# Patient Record
Sex: Female | Born: 1980
Health system: Southern US, Community
[De-identification: ages and names within clinical notes are randomized; demographics above are authoritative.]

## PROBLEM LIST (undated history)

## (undated) DIAGNOSIS — C4491 Basal cell carcinoma of skin, unspecified: Secondary | ICD-10-CM

## (undated) DIAGNOSIS — B029 Zoster without complications: Secondary | ICD-10-CM

## (undated) DIAGNOSIS — G43909 Migraine, unspecified, not intractable, without status migrainosus: Secondary | ICD-10-CM

## (undated) DIAGNOSIS — T8859XA Other complications of anesthesia, initial encounter: Secondary | ICD-10-CM

## (undated) DIAGNOSIS — Z1211 Encounter for screening for malignant neoplasm of colon: Secondary | ICD-10-CM

## (undated) DIAGNOSIS — R040 Epistaxis: Secondary | ICD-10-CM

## (undated) DIAGNOSIS — T4145XA Adverse effect of unspecified anesthetic, initial encounter: Secondary | ICD-10-CM

## (undated) DIAGNOSIS — H3320 Serous retinal detachment, unspecified eye: Secondary | ICD-10-CM

## (undated) DIAGNOSIS — T7840XA Allergy, unspecified, initial encounter: Secondary | ICD-10-CM

## (undated) DIAGNOSIS — Z800 Family history of malignant neoplasm of digestive organs: Secondary | ICD-10-CM

## (undated) DIAGNOSIS — B019 Varicella without complication: Secondary | ICD-10-CM

## (undated) DIAGNOSIS — O039 Complete or unspecified spontaneous abortion without complication: Secondary | ICD-10-CM

## (undated) HISTORY — DX: Serous retinal detachment, unspecified eye: H33.20

## (undated) HISTORY — DX: Complete or unspecified spontaneous abortion without complication: O03.9

## (undated) HISTORY — DX: Varicella without complication: B01.9

## (undated) HISTORY — PX: EYE SURGERY: SHX253

## (undated) HISTORY — PX: RETINAL DETACHMENT SURGERY: SHX105

## (undated) HISTORY — DX: Basal cell carcinoma of skin, unspecified: C44.91

## (undated) HISTORY — DX: Epistaxis: R04.0

## (undated) HISTORY — DX: Zoster without complications: B02.9

## (undated) HISTORY — DX: Encounter for screening for malignant neoplasm of colon: Z12.11

## (undated) HISTORY — PX: COSMETIC SURGERY: SHX468

## (undated) HISTORY — DX: Migraine, unspecified, not intractable, without status migrainosus: G43.909

## (undated) HISTORY — PX: AUGMENTATION MAMMAPLASTY: SUR837

## (undated) HISTORY — DX: Allergy, unspecified, initial encounter: T78.40XA

## (undated) HISTORY — DX: Family history of malignant neoplasm of digestive organs: Z80.0

---
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
## 2003-05-26 ENCOUNTER — Other Ambulatory Visit: Admission: RE | Admit: 2003-05-26 | Discharge: 2003-05-26 | Payer: Self-pay | Admitting: Obstetrics & Gynecology

## 2006-11-05 ENCOUNTER — Other Ambulatory Visit: Admission: RE | Admit: 2006-11-05 | Discharge: 2006-11-05 | Payer: Self-pay | Admitting: Obstetrics & Gynecology

## 2007-10-14 LAB — CONVERTED CEMR LAB: Pap Smear: NORMAL

## 2007-11-11 ENCOUNTER — Other Ambulatory Visit: Admission: RE | Admit: 2007-11-11 | Discharge: 2007-11-11 | Payer: Self-pay | Admitting: Obstetrics & Gynecology

## 2008-09-21 ENCOUNTER — Ambulatory Visit: Payer: Self-pay | Admitting: Family Medicine

## 2008-11-24 ENCOUNTER — Other Ambulatory Visit: Admission: RE | Admit: 2008-11-24 | Discharge: 2008-11-24 | Payer: Self-pay | Admitting: Obstetrics & Gynecology

## 2009-07-13 DIAGNOSIS — O039 Complete or unspecified spontaneous abortion without complication: Secondary | ICD-10-CM

## 2009-07-13 HISTORY — DX: Complete or unspecified spontaneous abortion without complication: O03.9

## 2010-04-24 ENCOUNTER — Observation Stay: Payer: Self-pay

## 2010-04-25 ENCOUNTER — Encounter: Payer: Self-pay | Admitting: Obstetrics and Gynecology

## 2010-05-30 ENCOUNTER — Encounter: Payer: Self-pay | Admitting: Obstetrics and Gynecology

## 2010-06-27 ENCOUNTER — Encounter: Payer: Self-pay | Admitting: Obstetrics and Gynecology

## 2010-07-19 ENCOUNTER — Inpatient Hospital Stay: Payer: Self-pay

## 2011-04-23 IMAGING — US US OB FOLLOW-UP - NRPT [facility]
1 series · 14 of 28 positions shown · non-contrast
Comparison: none

[Series 1: us ob follow-up - nrpt [facility] · 14 of 52 slices shown]
[im 2/52]
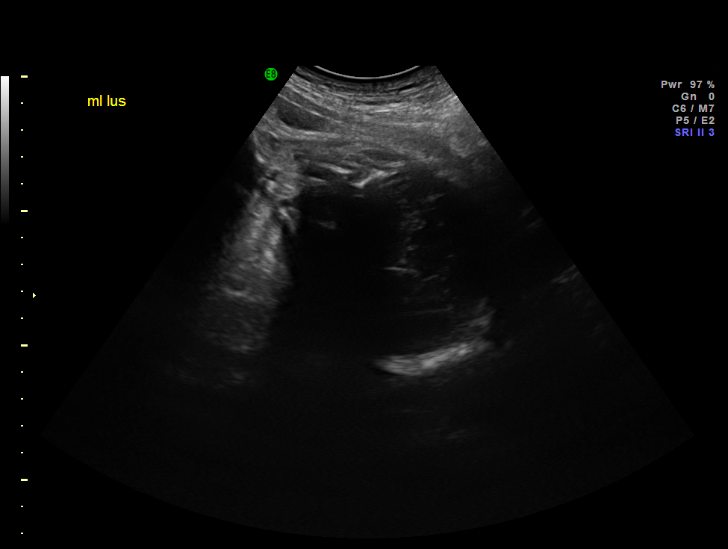
[im 6/52]
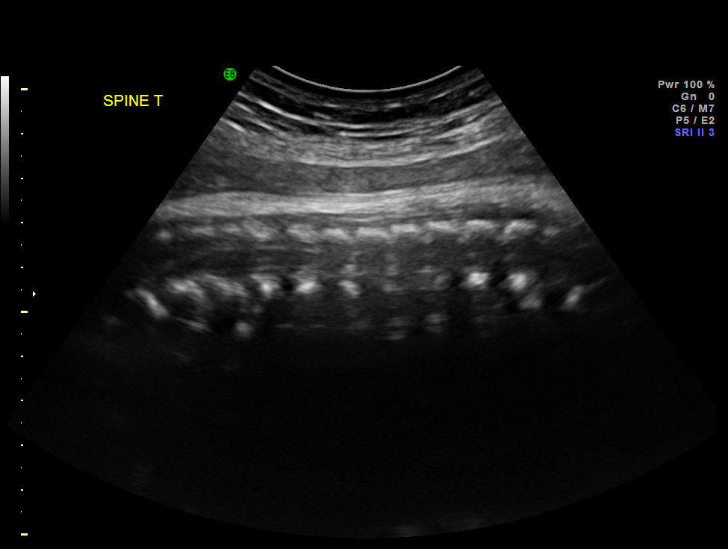
[im 10/52]
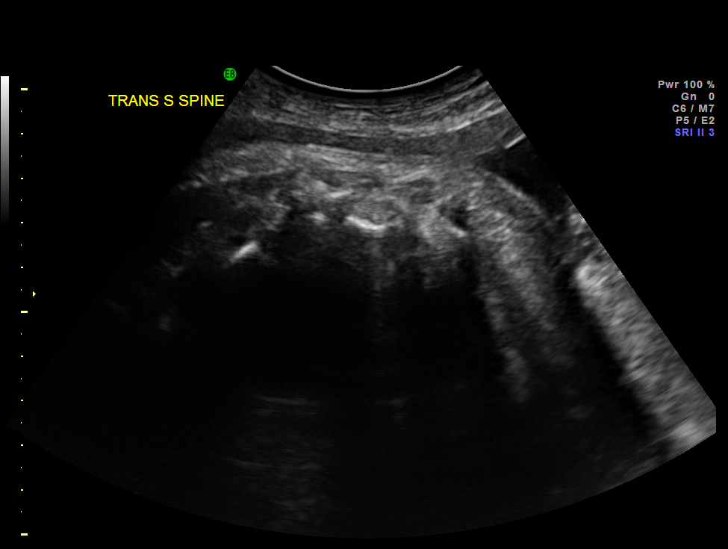
[im 14/52]
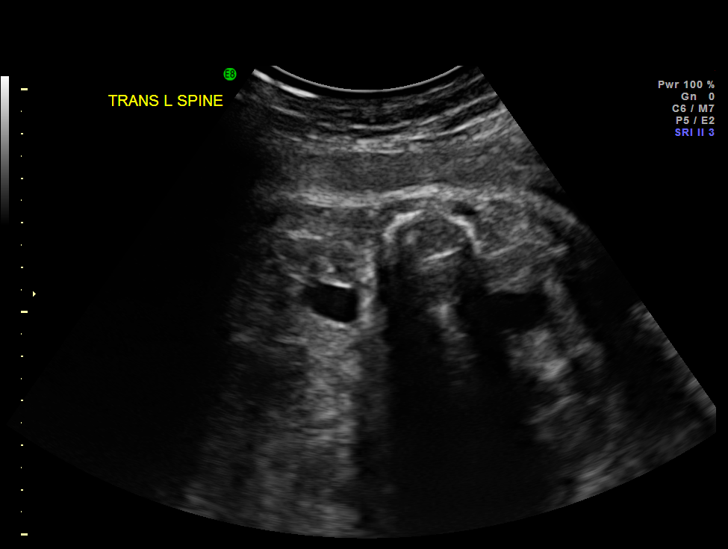
[im 18/52]
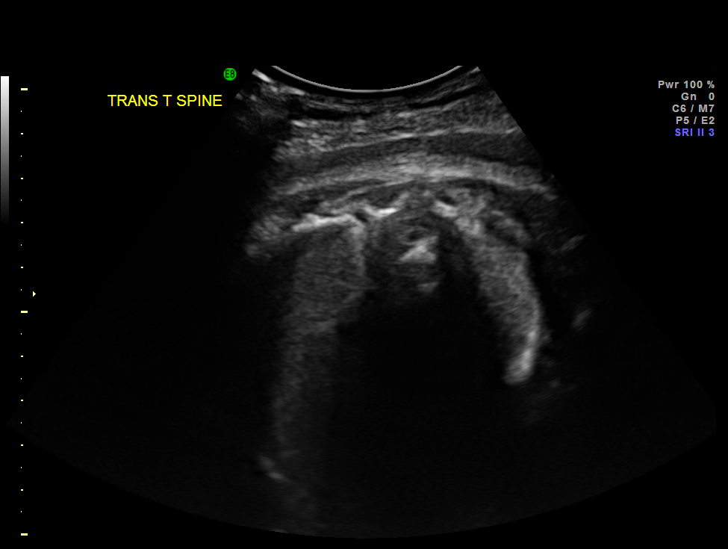
[im 21/52]
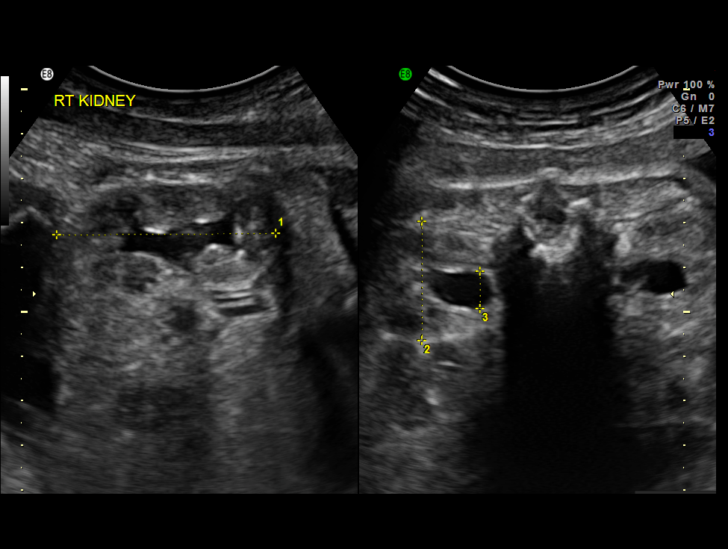
[im 25/52]
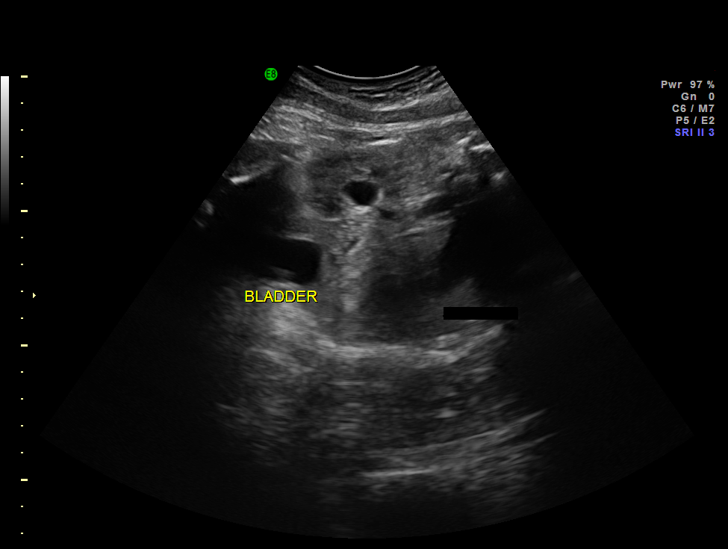
[im 29/52]
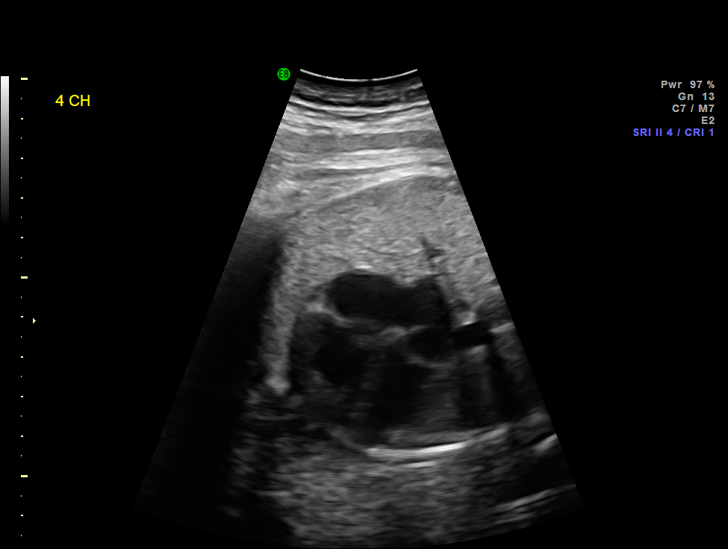
[im 33/52]
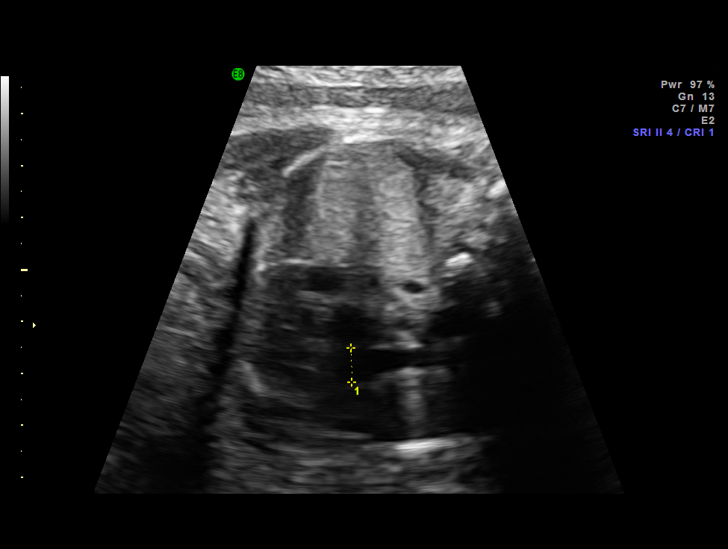
[im 36/52]
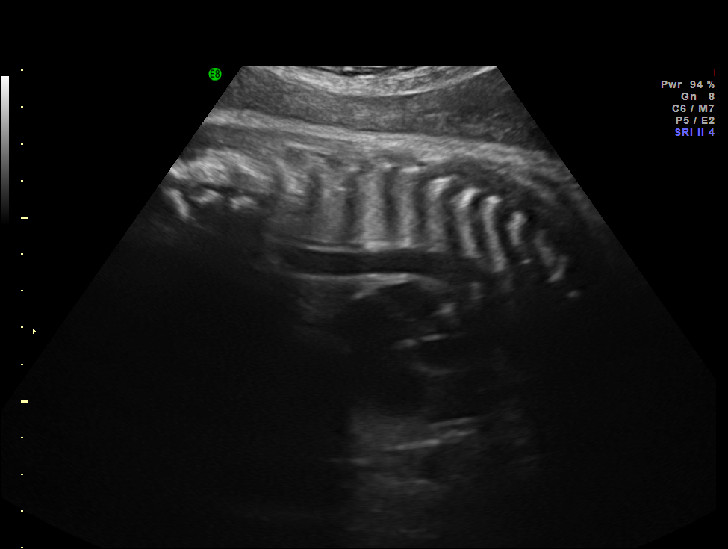
[im 40/52]
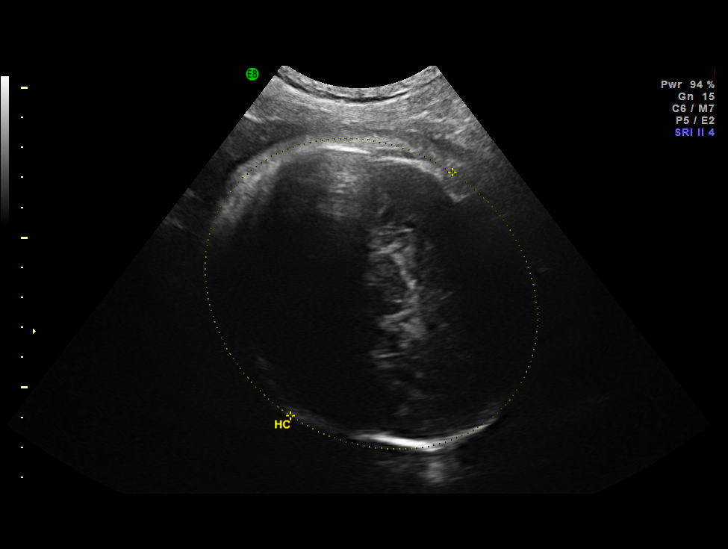
[im 44/52]
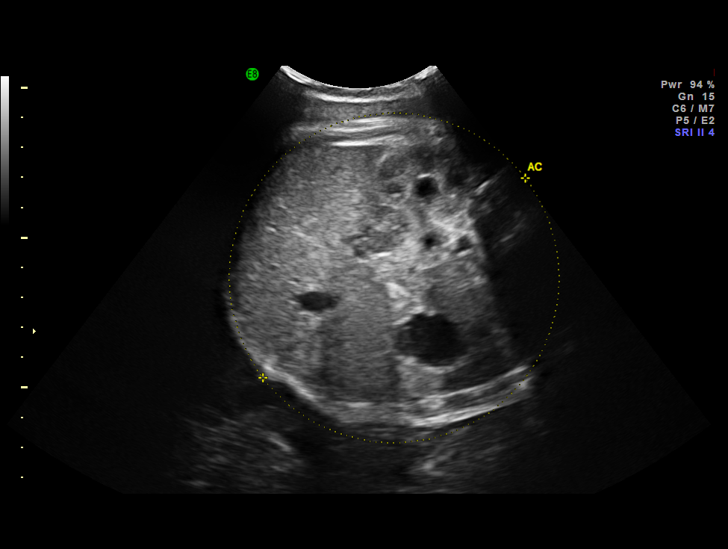
[im 48/52]
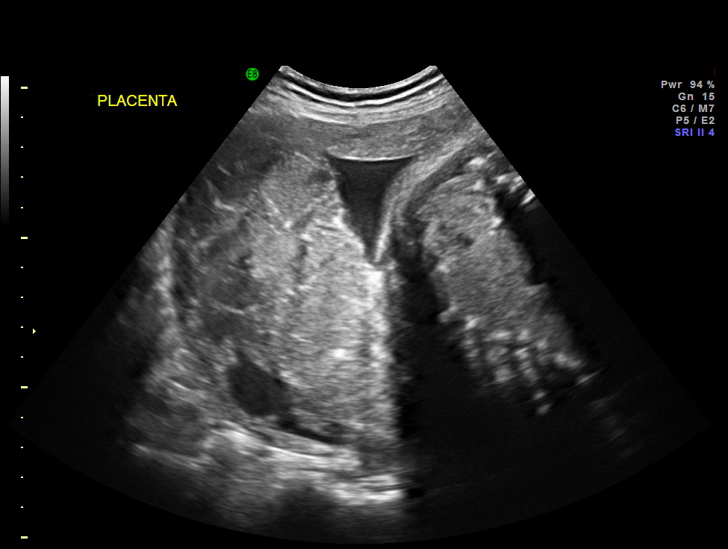
[im 52/52]
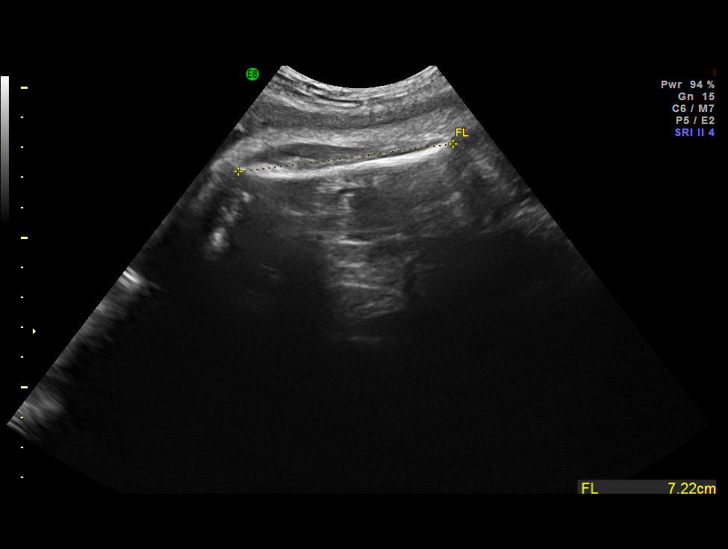

[14 of 28 positions shown; findings below may reference images not displayed]

IMAGES IMPORTED FROM THE SYNGO WORKFLOW SYSTEM
NO DICTATION FOR STUDY

## 2012-11-02 ENCOUNTER — Inpatient Hospital Stay: Payer: Self-pay

## 2012-11-02 LAB — CBC WITH DIFFERENTIAL/PLATELET
Basophil %: 0.4 %
Eosinophil %: 0.9 %
Lymphocyte #: 1.2 10*3/uL (ref 1.0–3.6)
Lymphocyte %: 12 %
MCHC: 34.2 g/dL (ref 32.0–36.0)
Monocyte #: 1 x10 3/mm — ABNORMAL HIGH (ref 0.2–0.9)
Monocyte %: 10 %
Neutrophil #: 7.5 10*3/uL — ABNORMAL HIGH (ref 1.4–6.5)
Neutrophil %: 76.7 %
RBC: 4.49 10*6/uL (ref 3.80–5.20)
RDW: 14.2 % (ref 11.5–14.5)

## 2012-11-04 LAB — HEMATOCRIT: HCT: 37 % (ref 35.0–47.0)

## 2014-07-13 HISTORY — PX: BREAST ENHANCEMENT SURGERY: SHX7

## 2015-02-20 NOTE — H&P (Signed)
L&D Evaluation:  History:   HPI 31o G3P1011 at [redacted]w[redacted]d presented to L&D last night in early labor, overnight contractions spaced out and she was able to sleep.  This morning pt is interested in augementation as her induction is scheduled in 2 days.  PNC at [facility], uncomplicated.  PNL - O+, RI, VI, GBS+    Presents with contractions    Patient's Medical History No Chronic Illness    Patient's Surgical History none    Medications Pre Natal Vitamins    Allergies NKDA    Social History none    Family History Non-Contributory   ROS:   ROS All systems were reviewed.  HEENT, CNS, GI, GU, Respiratory, CV, Renal and Musculoskeletal systems were found to be normal.   Exam:   Vital Signs stable    General no apparent distress    Mental Status clear    Chest normal effort    Abdomen gravid, non-tender    Estimated Fetal Weight Average for gestational age    Pelvic no external lesions, 4/ 80/ -1    Mebranes Intact    FHT normal rate with no decels, rective NST    Ucx irregular    Skin dry   Impression:   Impression G3P1011 at [redacted]w[redacted]d in latent labor   Plan:   Plan EFM/NST, monitor contractions and for cervical change, antibiotics for GBBS prophylaxis    Comments - discussed options including risks/benefits of continued expectant management vs. augmentation with artificial rupture of membranes and/or pitocin.  Pt is interested in artificial rupture of membranes then will monitor for ~1hour and start pitocin if contractions don't pick up.  (She has already received 2 doses of antibiotics)   Electronic Signatures: Malachi Paradise (MD)  (Signed 22-Jan-14 07:15)  Authored: L&D Evaluation   Last Updated: 22-Jan-14 07:15 by Thurmond Butts, Elinor Parkinson (MD)

## 2016-05-13 DIAGNOSIS — H3320 Serous retinal detachment, unspecified eye: Secondary | ICD-10-CM

## 2016-05-13 HISTORY — DX: Serous retinal detachment, unspecified eye: H33.20

## 2016-05-21 ENCOUNTER — Ambulatory Visit (INDEPENDENT_AMBULATORY_CARE_PROVIDER_SITE_OTHER): Payer: BLUE CROSS/BLUE SHIELD | Admitting: Family Medicine

## 2016-05-21 ENCOUNTER — Encounter: Payer: Self-pay | Admitting: Family Medicine

## 2016-05-21 ENCOUNTER — Ambulatory Visit (INDEPENDENT_AMBULATORY_CARE_PROVIDER_SITE_OTHER): Payer: BLUE CROSS/BLUE SHIELD

## 2016-05-21 VITALS — BP 117/81 | HR 81 | Temp 98.2°F | Ht 68.5 in | Wt 135.2 lb

## 2016-05-21 DIAGNOSIS — Z130 Encounter for screening for diseases of the blood and blood-forming organs and certain disorders involving the immune mechanism: Secondary | ICD-10-CM

## 2016-05-21 DIAGNOSIS — M25551 Pain in right hip: Secondary | ICD-10-CM

## 2016-05-21 DIAGNOSIS — H539 Unspecified visual disturbance: Secondary | ICD-10-CM | POA: Diagnosis not present

## 2016-05-21 DIAGNOSIS — Z13220 Encounter for screening for lipoid disorders: Secondary | ICD-10-CM

## 2016-05-21 DIAGNOSIS — Z0000 Encounter for general adult medical examination without abnormal findings: Secondary | ICD-10-CM

## 2016-05-21 DIAGNOSIS — Z719 Counseling, unspecified: Secondary | ICD-10-CM | POA: Insufficient documentation

## 2016-05-21 LAB — COMPREHENSIVE METABOLIC PANEL
ALBUMIN: 4.6 g/dL (ref 3.5–5.2)
ALK PHOS: 68 U/L (ref 39–117)
ALT: 18 U/L (ref 0–35)
AST: 17 U/L (ref 0–37)
BILIRUBIN TOTAL: 0.5 mg/dL (ref 0.2–1.2)
BUN: 10 mg/dL (ref 6–23)
CO2: 33 meq/L — AB (ref 19–32)
Calcium: 9.4 mg/dL (ref 8.4–10.5)
Chloride: 103 mEq/L (ref 96–112)
Creatinine, Ser: 0.78 mg/dL (ref 0.40–1.20)
GFR: 90.12 mL/min (ref >60.00)
GLUCOSE: 73 mg/dL (ref 70–99)
Potassium: 4.1 mEq/L (ref 3.5–5.1)
Sodium: 140 mEq/L (ref 135–145)
Total Protein: 7.2 g/dL (ref 6.0–8.3)

## 2016-05-21 LAB — CBC
HCT: 43.2 % (ref 36.0–46.0)
HEMOGLOBIN: 14.6 g/dL (ref 12.0–15.0)
MCHC: 33.8 g/dL (ref 30.0–36.0)
MCV: 86.5 fl (ref 78.0–100.0)
PLATELETS: 247 10*3/uL (ref 150.0–400.0)
RBC: 4.99 Mil/uL (ref 3.87–5.11)
RDW: 12.7 % (ref 11.5–15.5)
WBC: 5.8 10*3/uL (ref 4.0–10.5)

## 2016-05-21 LAB — LIPID PANEL
CHOL/HDL RATIO: 2
Cholesterol: 133 mg/dL (ref 0–200)
HDL: 60.5 mg/dL (ref >39.00)
LDL Cholesterol: 63 mg/dL (ref 0–99)
NONHDL: 72.96
Triglycerides: 48 mg/dL (ref 0.0–149.0)
VLDL: 9.6 mg/dL (ref 0.0–40.0)

## 2016-05-21 NOTE — Patient Instructions (Signed)
Get your eyes checked.  We will call with your xray results as well as with your lab results.  Follow up annually.  Take care  Dr. Lacinda Axon

## 2016-05-21 NOTE — Assessment & Plan Note (Signed)
Screening labs today

## 2016-05-21 NOTE — Assessment & Plan Note (Signed)
New acute problem. Patient had groin pain with ROM and especially with FADIR. Obtaining xray for further evaluation.

## 2016-05-21 NOTE — Progress Notes (Signed)
Subjective:  Patient ID: Ruth Williams, female    DOB: 1980-12-08  Age: 35 y.o. MRN: [id number]  CC: Establish care, Hip pain, Vision change  HPI Ruth Williams is a 35 y.o. female presents to the clinic today with the above complaints.  Right hip pain  Patient reports she's had right hip pain for months.  Pain is located laterally around the greater trochanter.  Worse at night with lying on her right side.  She does not recall any fall, trauma, injury.  Has been quite persistent and severe. However, has resolved over the past few days.  She is concerned about her recent hip pain and would like to discuss this further today.  Vision change  Patient states that for the past several weeks she's noticed a dark spot in her vision (right eye).  Patient states that it's intermittent.  Located in the right upper visual field.  She previously thought that she was having a migrainous she's had a history of visual disturbance of migraine in the past. However, she's had no headache, nausea, vomiting that is typical of her migraines.  Her last visual exam was a year ago and was normal.  No reports of flashing lights or floaters.  Left eye not affected.  No other social symptoms.  No other complaints this time.  PMH, Surgical Hx, Family Hx, Social History reviewed and updated as below.  Past Medical History:  Diagnosis Date  . Chicken pox   . Migraines    Past Surgical History:  Procedure Laterality Date  . BREAST ENHANCEMENT SURGERY  07/13/2014   Family History  Problem Relation Age of Onset  . Mental illness Mother   . Hypertension Mother   . Colon cancer Father   . Bladder Cancer Father    Social History  Substance Use Topics  . Smoking status: Never Smoker  . Smokeless tobacco: Never Used  . Alcohol use 3.0 oz/week    5 Glasses of wine per week    Review of Systems  Eyes: Positive for visual disturbance.  Musculoskeletal:       Right hip  pain.  Psychiatric/Behavioral:       Sadness.  All other systems reviewed and are negative.   Objective:   Today's Vitals: BP 117/81 (BP Location: Right Arm, Patient Position: Sitting, Cuff Size: Normal)   Pulse 81   Temp 98.2 F (36.8 C) (Oral)   Ht 5' 8.5" (1.74 m)   Wt 135 lb 4 oz (61.3 kg)   SpO2 100%   BMI 20.27 kg/m   Physical Exam  Constitutional: She is oriented to person, place, and time. She appears well-developed. No distress.  HENT:  Head: Normocephalic and atraumatic.  Mouth/Throat: Oropharynx is clear and moist.  Eyes: Conjunctivae are normal. No scleral icterus.  Neck: Neck supple.  Cardiovascular: Normal rate and regular rhythm.   Soft systolic murmur.  Pulmonary/Chest: Effort normal. She has no wheezes. She has no rales.  Abdominal: Soft. She exhibits no distension. There is no tenderness.  Musculoskeletal:  Hip: Right Full ROM. Pelvic alignment unremarkable to inspection and palpation. Greater trochanter without tenderness to palpation. Pain with FADIR.  Neurological: She is alert and oriented to person, place, and time.  Skin: Skin is warm and dry. No rash noted.  Psychiatric:  Flat affect.  Vitals reviewed.  Assessment & Plan:   Problem List Items Addressed This Visit    Health care maintenance    Screening labs today.  Right hip pain - Primary    New acute problem. Patient had groin pain with ROM and especially with FADIR. Obtaining xray for further evaluation.      Relevant Orders   Comp Met (CMET)   DG HIP UNILAT WITH PELVIS 1V RIGHT   Visual disturbance of one eye    New problem. Unclear prognosis/etiology at this time. Exam unremarkable. Advised formal assessment by Ophthalmology.  Patient in agreement.        Other Visit Diagnoses    Screening for deficiency anemia       Relevant Orders   CBC   Screening, lipid       Relevant Orders   Lipid Profile      No outpatient encounter prescriptions on file as of  05/21/2016.   No facility-administered encounter medications on file as of 05/21/2016.     Follow-up: Annually/sooner if needed.  [place]

## 2016-05-21 NOTE — Progress Notes (Signed)
Pre visit review using our clinic review tool, if applicable. No additional management support is needed unless otherwise documented below in the visit note. 

## 2016-05-21 NOTE — Assessment & Plan Note (Addendum)
New problem. Unclear prognosis/etiology at this time. Exam unremarkable. Advised formal assessment by Ophthalmology.  Patient in agreement.

## 2016-05-22 ENCOUNTER — Telehealth: Payer: Self-pay | Admitting: Family Medicine

## 2016-05-22 NOTE — Telephone Encounter (Signed)
Patient was given results 

## 2016-05-22 NOTE — Telephone Encounter (Signed)
Pt called back returning your call.   Call pt @ [phone]. Thank you!

## 2016-06-09 ENCOUNTER — Encounter (HOSPITAL_COMMUNITY): Payer: Self-pay | Admitting: *Deleted

## 2016-06-09 ENCOUNTER — Encounter (INDEPENDENT_AMBULATORY_CARE_PROVIDER_SITE_OTHER): Payer: BLUE CROSS/BLUE SHIELD | Admitting: Ophthalmology

## 2016-06-09 DIAGNOSIS — H43813 Vitreous degeneration, bilateral: Secondary | ICD-10-CM | POA: Diagnosis not present

## 2016-06-09 DIAGNOSIS — H338 Other retinal detachments: Secondary | ICD-10-CM

## 2016-06-09 DIAGNOSIS — H33302 Unspecified retinal break, left eye: Secondary | ICD-10-CM

## 2016-06-09 NOTE — H&P (Signed)
Ruth Williams is an 35 y.o. female.   Chief Complaint:Loss of superior visual field right eye HPI: one month progressive loss of superior visual field right eye  Past Medical History:  Diagnosis Date  . Chicken pox   . Migraines     Past Surgical History:  Procedure Laterality Date  . BREAST ENHANCEMENT SURGERY  07/13/2014    Family History  Problem Relation Age of Onset  . Mental illness Mother   . Hypertension Mother   . Colon cancer Father   . Bladder Cancer Father    Social History:  reports that she has never smoked. She has never used smokeless tobacco. She reports that she drinks about 3.0 oz of alcohol per week . She reports that she does not use drugs.  Allergies:  Allergies  Allergen Reactions  . No Known Allergies     No prescriptions prior to admission.    Review of systems otherwise negative  Weight 135 lb 4 oz (61.3 kg).  Physical exam: Mental status: oriented x3. Eyes: See eye exam associated with this date of surgery in media tab.  Scanned in by scanning center Ears, Nose, Throat: within normal limits Neck: Within Normal limits General: within normal limits Chest: Within normal limits Breast: deferred Heart: Within normal limits Abdomen: Within normal limits GU: deferred Extremities: within normal limits Skin: within normal limits  Assessment/Plan Rhegmatogenous retinal detachment right eye.  Retinal break Left eye Plan: To [facility] for Laser for retinal break left eye.  Scleral buckle, laser, gas injection right eye  Hayden Pedro 06/09/2016, 4:23 PM

## 2016-06-10 ENCOUNTER — Encounter (HOSPITAL_COMMUNITY): Payer: Self-pay | Admitting: Surgery

## 2016-06-10 ENCOUNTER — Ambulatory Visit (HOSPITAL_COMMUNITY): Payer: BLUE CROSS/BLUE SHIELD | Admitting: Certified Registered Nurse Anesthetist

## 2016-06-10 ENCOUNTER — Ambulatory Visit (HOSPITAL_COMMUNITY)
Admission: RE | Admit: 2016-06-10 | Discharge: 2016-06-11 | Disposition: A | Discharge status: Home / Self Care | Payer: BLUE CROSS/BLUE SHIELD | Source: Ambulatory Visit | Attending: Ophthalmology | Admitting: Ophthalmology

## 2016-06-10 ENCOUNTER — Encounter (HOSPITAL_COMMUNITY)
Admission: RE | Disposition: A | Discharge status: Home / Self Care | Payer: Self-pay | Source: Ambulatory Visit | Attending: Ophthalmology

## 2016-06-10 DIAGNOSIS — H33002 Unspecified retinal detachment with retinal break, left eye: Secondary | ICD-10-CM | POA: Insufficient documentation

## 2016-06-10 DIAGNOSIS — H33302 Unspecified retinal break, left eye: Secondary | ICD-10-CM | POA: Diagnosis not present

## 2016-06-10 DIAGNOSIS — H33001 Unspecified retinal detachment with retinal break, right eye: Secondary | ICD-10-CM | POA: Diagnosis not present

## 2016-06-10 HISTORY — PX: AIR/FLUID EXCHANGE: SHX6494

## 2016-06-10 HISTORY — DX: Adverse effect of unspecified anesthetic, initial encounter: T41.45XA

## 2016-06-10 HISTORY — DX: Other complications of anesthesia, initial encounter: T88.59XA

## 2016-06-10 HISTORY — PX: PHOTOCOAGULATION WITH LASER: SHX6027

## 2016-06-10 HISTORY — PX: SCLERAL BUCKLE: SHX5340

## 2016-06-10 SURGERY — PHOTOCOAGULATION, EYE, USING LASER
Anesthesia: General | Site: Eye | Laterality: Right

## 2016-06-10 MED ORDER — PHENYLEPHRINE HCL 10 MG/ML IJ SOLN
INTRAVENOUS | Status: DC | PRN
  Administered 2016-06-10: 20 ug/min via INTRAVENOUS

## 2016-06-10 MED ORDER — ATROPINE SULFATE 1 % OP SOLN
OPHTHALMIC | Status: DC | PRN
  Administered 2016-06-10: 1 [drp] via OPHTHALMIC

## 2016-06-10 MED ORDER — DEXAMETHASONE SODIUM PHOSPHATE 10 MG/ML IJ SOLN
INTRAMUSCULAR | Status: DC | PRN
  Administered 2016-06-10: 10 mg

## 2016-06-10 MED ORDER — FENTANYL CITRATE (PF) 100 MCG/2ML IJ SOLN
INTRAMUSCULAR | Status: AC
  Filled 2016-06-10: qty 4

## 2016-06-10 MED ORDER — SUGAMMADEX SODIUM 200 MG/2ML IV SOLN
INTRAVENOUS | Status: AC
  Filled 2016-06-10: qty 4

## 2016-06-10 MED ORDER — MORPHINE SULFATE (PF) 2 MG/ML IV SOLN
1.0000 mg | INTRAVENOUS | Status: DC | PRN
Start: 2016-06-10 — End: 2016-06-11

## 2016-06-10 MED ORDER — PROPOFOL 10 MG/ML IV BOLUS
INTRAVENOUS | Status: AC
  Filled 2016-06-10: qty 20

## 2016-06-10 MED ORDER — ONDANSETRON HCL 4 MG/2ML IJ SOLN: 4.0000 mg | Freq: Four times a day (QID) | INTRAMUSCULAR | Status: DC | PRN

## 2016-06-10 MED ORDER — BACITRACIN-POLYMYXIN B 500-10000 UNIT/GM OP OINT
1.0000 "application " | TOPICAL_OINTMENT | Freq: Three times a day (TID) | OPHTHALMIC | Status: DC
  Filled 2016-06-10: qty 3.5

## 2016-06-10 MED ORDER — GLYCOPYRROLATE 0.2 MG/ML IV SOSY
PREFILLED_SYRINGE | INTRAVENOUS | Status: DC | PRN
  Administered 2016-06-10: .4 mg via INTRAVENOUS

## 2016-06-10 MED ORDER — TETRACAINE HCL 0.5 % OP SOLN
2.0000 [drp] | Freq: Once | OPHTHALMIC | Status: DC
  Filled 2016-06-10: qty 2

## 2016-06-10 MED ORDER — LEVONORGESTREL 20 MCG/24HR IU IUD: 1.0000 | INTRAUTERINE_SYSTEM | Freq: Once | INTRAUTERINE | Status: DC

## 2016-06-10 MED ORDER — ACETAZOLAMIDE SODIUM 500 MG IJ SOLR
INTRAMUSCULAR | Status: AC
  Filled 2016-06-10: qty 500

## 2016-06-10 MED ORDER — DORZOLAMIDE HCL 2 % OP SOLN
1.0000 [drp] | Freq: Three times a day (TID) | OPHTHALMIC | Status: DC
  Filled 2016-06-10 (×2): qty 10

## 2016-06-10 MED ORDER — PHENYLEPHRINE HCL 2.5 % OP SOLN
1.0000 [drp] | OPHTHALMIC | Status: AC
  Administered 2016-06-10 (×3): 1 [drp] via OPHTHALMIC
  Filled 2016-06-10: qty 2

## 2016-06-10 MED ORDER — NEOSTIGMINE METHYLSULFATE 5 MG/5ML IV SOSY
PREFILLED_SYRINGE | INTRAVENOUS | Status: DC | PRN
  Administered 2016-06-10: 3 mg via INTRAVENOUS

## 2016-06-10 MED ORDER — LIDOCAINE 2% (20 MG/ML) 5 ML SYRINGE
INTRAMUSCULAR | Status: DC | PRN
  Administered 2016-06-10: 70 mg via INTRAVENOUS

## 2016-06-10 MED ORDER — BRIMONIDINE TARTRATE 0.2 % OP SOLN
1.0000 [drp] | Freq: Two times a day (BID) | OPHTHALMIC | Status: DC
  Filled 2016-06-10: qty 5

## 2016-06-10 MED ORDER — HYPROMELLOSE (GONIOSCOPIC) 2.5 % OP SOLN
OPHTHALMIC | Status: AC
  Filled 2016-06-10: qty 15

## 2016-06-10 MED ORDER — TEMAZEPAM 15 MG PO CAPS: 15.0000 mg | ORAL_CAPSULE | Freq: Every evening | ORAL | Status: DC | PRN

## 2016-06-10 MED ORDER — ATROPINE SULFATE 1 % OP SOLN
OPHTHALMIC | Status: AC
  Filled 2016-06-10: qty 5

## 2016-06-10 MED ORDER — ERYTHROMYCIN 5 MG/GM OP OINT
TOPICAL_OINTMENT | OPHTHALMIC | Status: DC | PRN
Start: 2016-06-10 — End: 2016-06-10
  Administered 2016-06-10: 1 via OPHTHALMIC

## 2016-06-10 MED ORDER — EPINEPHRINE HCL 1 MG/ML IJ SOLN
INTRAMUSCULAR | Status: AC
  Filled 2016-06-10: qty 1

## 2016-06-10 MED ORDER — GATIFLOXACIN 0.5 % OP SOLN
1.0000 [drp] | Freq: Four times a day (QID) | OPHTHALMIC | Status: DC
  Filled 2016-06-10: qty 2.5

## 2016-06-10 MED ORDER — ALBUTEROL SULFATE HFA 108 (90 BASE) MCG/ACT IN AERS
INHALATION_SPRAY | RESPIRATORY_TRACT | Status: AC
  Filled 2016-06-10: qty 6.7

## 2016-06-10 MED ORDER — DEXAMETHASONE SODIUM PHOSPHATE 10 MG/ML IJ SOLN
INTRAMUSCULAR | Status: DC | PRN
  Administered 2016-06-10: 10 mg via INTRAVENOUS

## 2016-06-10 MED ORDER — BSS IO SOLN
INTRAOCULAR | Status: DC | PRN
  Administered 2016-06-10: 15 mL via INTRAOCULAR

## 2016-06-10 MED ORDER — IBUPROFEN 200 MG PO TABS
200.0000 mg | ORAL_TABLET | Freq: Every day | ORAL | Status: DC | PRN
Start: 2016-06-10 — End: 2016-06-11

## 2016-06-10 MED ORDER — DEXAMETHASONE SODIUM PHOSPHATE 10 MG/ML IJ SOLN
INTRAMUSCULAR | Status: AC
  Filled 2016-06-10: qty 1

## 2016-06-10 MED ORDER — BUPIVACAINE HCL (PF) 0.75 % IJ SOLN
INTRAMUSCULAR | Status: AC
  Filled 2016-06-10: qty 10

## 2016-06-10 MED ORDER — FENTANYL CITRATE (PF) 100 MCG/2ML IJ SOLN
INTRAMUSCULAR | Status: AC
  Filled 2016-06-10: qty 2

## 2016-06-10 MED ORDER — ONDANSETRON HCL 4 MG/2ML IJ SOLN
INTRAMUSCULAR | Status: DC | PRN
  Administered 2016-06-10: 4 mg via INTRAVENOUS

## 2016-06-10 MED ORDER — LATANOPROST 0.005 % OP SOLN
1.0000 [drp] | Freq: Every day | OPHTHALMIC | Status: DC
  Filled 2016-06-10 (×2): qty 2.5

## 2016-06-10 MED ORDER — BUPIVACAINE HCL (PF) 0.75 % IJ SOLN
INTRAMUSCULAR | Status: DC | PRN
  Administered 2016-06-10: 10 mL

## 2016-06-10 MED ORDER — TROPICAMIDE 1 % OP SOLN
1.0000 [drp] | OPHTHALMIC | Status: AC
  Administered 2016-06-10 (×3): 1 [drp] via OPHTHALMIC
  Filled 2016-06-10: qty 3

## 2016-06-10 MED ORDER — POLYMYXIN B SULFATE 500000 UNITS IJ SOLR
INTRAMUSCULAR | Status: AC
  Filled 2016-06-10: qty 1

## 2016-06-10 MED ORDER — PROPOFOL 10 MG/ML IV BOLUS
INTRAVENOUS | Status: DC | PRN
  Administered 2016-06-10: 50 mg via INTRAVENOUS
  Administered 2016-06-10: 20 mg via INTRAVENOUS
  Administered 2016-06-10: 150 mg via INTRAVENOUS

## 2016-06-10 MED ORDER — ROCURONIUM BROMIDE 10 MG/ML (PF) SYRINGE
PREFILLED_SYRINGE | INTRAVENOUS | Status: AC
  Filled 2016-06-10: qty 10

## 2016-06-10 MED ORDER — PREDNISOLONE ACETATE 1 % OP SUSP
1.0000 [drp] | Freq: Four times a day (QID) | OPHTHALMIC | Status: DC
  Filled 2016-06-10: qty 5

## 2016-06-10 MED ORDER — SODIUM CHLORIDE 0.45 % IV SOLN
INTRAVENOUS | Status: DC
  Administered 2016-06-10: 20:00:00 via INTRAVENOUS

## 2016-06-10 MED ORDER — PHENYLEPHRINE 40 MCG/ML (10ML) SYRINGE FOR IV PUSH (FOR BLOOD PRESSURE SUPPORT)
PREFILLED_SYRINGE | INTRAVENOUS | Status: DC | PRN
  Administered 2016-06-10: 40 ug via INTRAVENOUS

## 2016-06-10 MED ORDER — SODIUM HYALURONATE 10 MG/ML IO SOLN
INTRAOCULAR | Status: AC
  Filled 2016-06-10: qty 0.85

## 2016-06-10 MED ORDER — HYDROCODONE-ACETAMINOPHEN 5-325 MG PO TABS: 1.0000 | ORAL_TABLET | ORAL | Status: DC | PRN

## 2016-06-10 MED ORDER — CEFTAZIDIME 1 G IJ SOLR
INTRAMUSCULAR | Status: AC
  Filled 2016-06-10: qty 1

## 2016-06-10 MED ORDER — MAGNESIUM HYDROXIDE 400 MG/5ML PO SUSP: 15.0000 mL | Freq: Four times a day (QID) | ORAL | Status: DC | PRN

## 2016-06-10 MED ORDER — LIDOCAINE HCL 2 % IJ SOLN
INTRAMUSCULAR | Status: AC
  Filled 2016-06-10: qty 20

## 2016-06-10 MED ORDER — BSS PLUS IO SOLN
INTRAOCULAR | Status: AC
  Filled 2016-06-10: qty 500

## 2016-06-10 MED ORDER — SODIUM CHLORIDE 0.9 % IJ SOLN
INTRAMUSCULAR | Status: AC
  Filled 2016-06-10: qty 10

## 2016-06-10 MED ORDER — LIDOCAINE 2% (20 MG/ML) 5 ML SYRINGE
INTRAMUSCULAR | Status: AC
Start: 2016-06-10 — End: 2016-06-10
  Filled 2016-06-10: qty 10

## 2016-06-10 MED ORDER — ROCURONIUM BROMIDE 10 MG/ML (PF) SYRINGE
PREFILLED_SYRINGE | INTRAVENOUS | Status: DC | PRN
  Administered 2016-06-10 (×4): 10 mg via INTRAVENOUS
  Administered 2016-06-10: 30 mg via INTRAVENOUS

## 2016-06-10 MED ORDER — SODIUM CHLORIDE 0.9 % IV SOLN
INTRAVENOUS | Status: DC
  Administered 2016-06-10 (×2): via INTRAVENOUS

## 2016-06-10 MED ORDER — MIDAZOLAM HCL 5 MG/5ML IJ SOLN
INTRAMUSCULAR | Status: DC | PRN
  Administered 2016-06-10: 2 mg via INTRAVENOUS

## 2016-06-10 MED ORDER — GATIFLOXACIN 0.5 % OP SOLN
1.0000 [drp] | OPHTHALMIC | Status: AC
  Administered 2016-06-10 (×3): 1 [drp] via OPHTHALMIC
  Filled 2016-06-10: qty 2.5

## 2016-06-10 MED ORDER — MIDAZOLAM HCL 2 MG/2ML IJ SOLN
INTRAMUSCULAR | Status: AC
  Filled 2016-06-10: qty 2

## 2016-06-10 MED ORDER — STERILE WATER FOR INJECTION IJ SOLN
INTRAMUSCULAR | Status: AC
  Filled 2016-06-10: qty 20

## 2016-06-10 MED ORDER — TRIAMCINOLONE ACETONIDE 40 MG/ML IJ SUSP
INTRAMUSCULAR | Status: AC
  Filled 2016-06-10: qty 5

## 2016-06-10 MED ORDER — EPHEDRINE 5 MG/ML INJ
INTRAVENOUS | Status: AC
  Filled 2016-06-10: qty 10

## 2016-06-10 MED ORDER — ACETAZOLAMIDE SODIUM 500 MG IJ SOLR
500.0000 mg | Freq: Once | INTRAMUSCULAR | Status: AC
  Administered 2016-06-11: 500 mg via INTRAVENOUS
  Filled 2016-06-10: qty 500

## 2016-06-10 MED ORDER — CYCLOPENTOLATE HCL 1 % OP SOLN
1.0000 [drp] | OPHTHALMIC | Status: AC
  Administered 2016-06-10 (×3): 1 [drp] via OPHTHALMIC
  Filled 2016-06-10: qty 2

## 2016-06-10 MED ORDER — BRIMONIDINE TARTRATE 0.15 % OP SOLN
1.0000 [drp] | Freq: Two times a day (BID) | OPHTHALMIC | Status: DC
  Filled 2016-06-10: qty 5

## 2016-06-10 MED ORDER — ACETAMINOPHEN 325 MG PO TABS: 325.0000 mg | ORAL_TABLET | ORAL | Status: DC | PRN

## 2016-06-10 MED ORDER — CEFAZOLIN SODIUM-DEXTROSE 2-4 GM/100ML-% IV SOLN
2.0000 g | INTRAVENOUS | Status: AC
  Administered 2016-06-10: 2 g via INTRAVENOUS
  Filled 2016-06-10: qty 100

## 2016-06-10 MED ORDER — FENTANYL CITRATE (PF) 100 MCG/2ML IJ SOLN
INTRAMUSCULAR | Status: DC | PRN
  Administered 2016-06-10: 50 ug via INTRAVENOUS
  Administered 2016-06-10: 25 ug via INTRAVENOUS
  Administered 2016-06-10: 100 ug via INTRAVENOUS
  Administered 2016-06-10 (×3): 25 ug via INTRAVENOUS
  Administered 2016-06-10: 50 ug via INTRAVENOUS

## 2016-06-10 MED ORDER — BSS IO SOLN
INTRAOCULAR | Status: AC
  Filled 2016-06-10: qty 15

## 2016-06-10 MED ORDER — STERILE WATER FOR INJECTION IJ SOLN
INTRAMUSCULAR | Status: DC | PRN
  Administered 2016-06-10: 20 mL

## 2016-06-10 SURGICAL SUPPLY — 71 items
APPLICATOR DR MATTHEWS STRL (MISCELLANEOUS) ×28 IMPLANT
BLADE EYE CATARACT 19 1.4 BEAV (BLADE) ×8 IMPLANT
BLADE MVR KNIFE 19G (BLADE) IMPLANT
CANNULA ANT CHAM MAIN (OPHTHALMIC RELATED) IMPLANT
CANNULA DUAL BORE 23G (CANNULA) IMPLANT
CORDS BIPOLAR (ELECTRODE) ×4 IMPLANT
COTTONBALL LRG STERILE PKG (GAUZE/BANDAGES/DRESSINGS) ×12 IMPLANT
COVER MAYO STAND STRL (DRAPES) IMPLANT
COVER SURGICAL LIGHT HANDLE (MISCELLANEOUS) ×4 IMPLANT
DRAPE INCISE 51X51 W/FILM STRL (DRAPES) ×4 IMPLANT
DRAPE OPHTHALMIC 77X100 STRL (CUSTOM PROCEDURE TRAY) ×4 IMPLANT
ERASER HMR WETFIELD 23G BP (MISCELLANEOUS) IMPLANT
FILTER BLUE MILLIPORE (MISCELLANEOUS) ×4 IMPLANT
FILTER STRAW FLUID ASPIR (MISCELLANEOUS) IMPLANT
FORCEPS GRIESHABER ILM 25G A (INSTRUMENTS) IMPLANT
GAS AUTO FILL CONSTEL (OPHTHALMIC)
GAS AUTO FILL CONSTELLATION (OPHTHALMIC) IMPLANT
GLOVE SS BIOGEL STRL SZ 6.5 (GLOVE) ×4 IMPLANT
GLOVE SS BIOGEL STRL SZ 7 (GLOVE) ×4 IMPLANT
GLOVE SUPERSENSE BIOGEL SZ 6.5 (GLOVE) ×4
GLOVE SUPERSENSE BIOGEL SZ 7 (GLOVE) ×4
GLOVE SURG 8.5 LATEX PF (GLOVE) ×4 IMPLANT
GOWN STRL REUS W/ TWL LRG LVL3 (GOWN DISPOSABLE) ×6 IMPLANT
GOWN STRL REUS W/TWL LRG LVL3 (GOWN DISPOSABLE) ×6
HANDLE PNEUMATIC FOR CONSTEL (OPHTHALMIC) IMPLANT
IMPL SILICONE (Ophthalmic Related) ×2 IMPLANT
IMPLANT SILICONE (Ophthalmic Related) ×6 IMPLANT
KIT BASIN OR (CUSTOM PROCEDURE TRAY) ×4 IMPLANT
KIT PERFLUORON PROCEDURE 5ML (MISCELLANEOUS) IMPLANT
KIT ROOM TURNOVER OR (KITS) ×4 IMPLANT
KNIFE CRESCENT 1.75 EDGEAHEAD (BLADE) IMPLANT
KNIFE GRIESHABER SHARP 2.5MM (MISCELLANEOUS) ×12 IMPLANT
MICROPICK 25G (MISCELLANEOUS)
NEEDLE 18GX1X1/2 (RX/OR ONLY) (NEEDLE) ×4 IMPLANT
NEEDLE 25GX 5/8IN NON SAFETY (NEEDLE) IMPLANT
NEEDLE 27GAX1X1/2 (NEEDLE) IMPLANT
NEEDLE HYPO 30X.5 LL (NEEDLE) ×12 IMPLANT
NS IRRIG 1000ML POUR BTL (IV SOLUTION) ×4 IMPLANT
PACK VITRECTOMY CUSTOM (CUSTOM PROCEDURE TRAY) ×4 IMPLANT
PAD ARMBOARD 7.5X6 YLW CONV (MISCELLANEOUS) ×8 IMPLANT
PAK PIK VITRECTOMY CVS 25GA (OPHTHALMIC) IMPLANT
PIC ILLUMINATED 25G (OPHTHALMIC)
PICK MICROPICK 25G (MISCELLANEOUS) IMPLANT
PIK ILLUMINATED 25G (OPHTHALMIC) IMPLANT
PROBE LASER ILLUM FLEX CVD 25G (OPHTHALMIC) IMPLANT
REPL STRA BRUSH NEEDLE (NEEDLE) IMPLANT
RESERVOIR BACK FLUSH (MISCELLANEOUS) IMPLANT
ROLLS DENTAL (MISCELLANEOUS) ×8 IMPLANT
SLEEVE SCLERAL TYPE 270 (Ophthalmic Related) ×4 IMPLANT
SPEAR EYE SURG WECK-CEL (MISCELLANEOUS) ×20 IMPLANT
SPONGE SURGIFOAM ABS GEL 12-7 (HEMOSTASIS) IMPLANT
STOPCOCK 4 WAY LG BORE MALE ST (IV SETS) IMPLANT
SUT CHROMIC 7 0 TG140 8 (SUTURE) ×4 IMPLANT
SUT ETHILON 9 0 TG140 8 (SUTURE) IMPLANT
SUT MERSILENE 4 0 RV 2 (SUTURE) ×8 IMPLANT
SUT SILK 2 0 (SUTURE) ×2
SUT SILK 2-0 18XBRD TIE 12 (SUTURE) ×2 IMPLANT
SUT SILK 4 0 RB 1 (SUTURE) ×4 IMPLANT
SYR 20CC LL (SYRINGE) IMPLANT
SYR 50ML LL SCALE MARK (SYRINGE) IMPLANT
SYR 5ML LL (SYRINGE) ×8 IMPLANT
SYR BULB 3OZ (MISCELLANEOUS) ×4 IMPLANT
SYR TB 1ML LUER SLIP (SYRINGE) IMPLANT
SYRINGE 10CC LL (SYRINGE) IMPLANT
TAPE SURG TRANSPORE 1 IN (GAUZE/BANDAGES/DRESSINGS) ×2 IMPLANT
TAPE SURGICAL TRANSPORE 1 IN (GAUZE/BANDAGES/DRESSINGS) ×2
TIRE RTNL 2.5XGRV CNCV 9X (Ophthalmic Related) ×2 IMPLANT
TOWEL OR 17X24 6PK STRL BLUE (TOWEL DISPOSABLE) ×4 IMPLANT
TUBING HIGH PRESS EXTEN 6IN (TUBING) IMPLANT
WATER STERILE IRR 1000ML POUR (IV SOLUTION) ×4 IMPLANT
WIPE INSTRUMENT VISIWIPE 73X73 (MISCELLANEOUS) ×4 IMPLANT

## 2016-06-10 NOTE — Brief Op Note (Signed)
Brief Operative note   Preoperative diagnosis:  retinal detachment right eye, retinal break left eye Postoperative diagnosis  * No Diagnosis Codes entered *  Procedures: Laser for retinal break left eye.  Scleral buckle right eye with laser.  Surgeon:  Hayden Pedro, MD...  Assistant:  Deatra Ina SA    Anesthesia: General  Specimen: none  Estimated blood loss:  1cc  Complications: none  Patient sent to PACU in good condition  Composed by Hayden Pedro MD  Dictation number: [id number]

## 2016-06-10 NOTE — Transfer of Care (Signed)
Immediate Anesthesia Transfer of Care Note  Patient: Ruth Williams  Procedure(s) Performed: Procedure(s): PHOTOCOAGULATION WITH LASER - HEADSCOPE LASER (Bilateral) SCLERAL BUCKLE (Right) AIR/FLUID EXCHANGE (Right)  Patient Location: PACU  Anesthesia Type:General  Level of Consciousness: oriented and patient cooperative, drowsy   Airway & Oxygen Therapy: Patient Spontanous Breathing  Post-op Assessment: Report given to RN and Post -op Vital signs reviewed and stable  Post vital signs: Reviewed and stable  Last Vitals:  Vitals:   06/10/16 1113 06/10/16 1741  BP: 122/80   Pulse: 87   Resp: 18   Temp: 37 C 36.9 C    Last Pain:  Vitals:   06/10/16 1113  TempSrc: Oral         Complications: No apparent anesthesia complications

## 2016-06-10 NOTE — Anesthesia Procedure Notes (Signed)
Procedure Name: Intubation Date/Time: 06/10/2016 3:30 PM Performed by: Merdis Delay Pre-anesthesia Checklist: Patient identified, Emergency Drugs available, Suction available, Patient being monitored and Timeout performed Patient Re-evaluated:Patient Re-evaluated prior to inductionOxygen Delivery Method: Circle system utilized Preoxygenation: Pre-oxygenation with 100% oxygen Intubation Type: IV induction Ventilation: Mask ventilation without difficulty Laryngoscope Size: Mac and 3 Grade View: Grade II Tube type: Oral Tube size: 7.0 mm Number of attempts: 1 Airway Equipment and Method: Stylet Placement Confirmation: ETT inserted through vocal cords under direct vision,  breath sounds checked- equal and bilateral,  CO2 detector and positive ETCO2 Tube secured with: Tape Dental Injury: Teeth and Oropharynx as per pre-operative assessment  Comments: Mouth opening tight after induction

## 2016-06-10 NOTE — Progress Notes (Signed)
Admitted to room 6n20 from PACU post eye surgery. Patient alert and oriented,VSS. oriented to unit and staff, call light within reach , Service response called for dinner tray. Kept patient comfortably in bed,denies pain Will endorse appropriately.

## 2016-06-10 NOTE — H&P (Signed)
I examined the patient today and there is no change in the medical status 

## 2016-06-10 NOTE — Anesthesia Preprocedure Evaluation (Addendum)
Anesthesia Evaluation  Patient identified by MRN, date of birth, ID band Patient awake    Reviewed: Allergy & Precautions, NPO status , Patient's Chart, lab work & pertinent test results  History of Anesthesia Complications (+) history of anesthetic complications (patient states she woke up crazy)  Airway Mallampati: II  TM Distance: >3 FB Neck ROM: Full    Dental  (+) Teeth Intact, Dental Advisory Given   Pulmonary neg pulmonary ROS, neg shortness of breath, neg sleep apnea, neg COPD, neg recent URI,    Pulmonary exam normal breath sounds clear to auscultation       Cardiovascular negative cardio ROS   Rhythm:Regular Rate:Normal     Neuro/Psych  Headaches, neg Seizures    GI/Hepatic negative GI ROS, Neg liver ROS, neg GERD  ,  Endo/Other  negative endocrine ROS  Renal/GU negative Renal ROS     Musculoskeletal   Abdominal (+) - obese,   Peds  Hematology negative hematology ROS (+)   Anesthesia Other Findings   Reproductive/Obstetrics                           Anesthesia Physical Anesthesia Plan  ASA: II  Anesthesia Plan: General   Post-op Pain Management:    Induction: Intravenous  Airway Management Planned: Oral ETT  Additional Equipment:   Intra-op Plan:   Post-operative Plan: Extubation in OR  Informed Consent: I have reviewed the patients History and Physical, chart, labs and discussed the procedure including the risks, benefits and alternatives for the proposed anesthesia with the patient or authorized representative who has indicated his/her understanding and acceptance.   Dental advisory given  Plan Discussed with:   Anesthesia Plan Comments: (Risks of general anesthesia discussed including, but not limited to, sore throat, hoarse voice, chipped/damaged teeth, injury to vocal cords, nausea and vomiting, allergic reactions, lung infection, heart attack, stroke, and  death. All questions answered. )     Anesthesia Quick Evaluation

## 2016-06-11 ENCOUNTER — Encounter (HOSPITAL_COMMUNITY): Payer: Self-pay | Admitting: Ophthalmology

## 2016-06-11 DIAGNOSIS — H33002 Unspecified retinal detachment with retinal break, left eye: Secondary | ICD-10-CM | POA: Diagnosis not present

## 2016-06-11 MED ORDER — HYDROCODONE-ACETAMINOPHEN 5-325 MG PO TABS: 1.0000 | ORAL_TABLET | ORAL | 0 refills | Status: DC | PRN

## 2016-06-11 MED ORDER — BACITRACIN-POLYMYXIN B 500-10000 UNIT/GM OP OINT: 1.0000 "application " | TOPICAL_OINTMENT | Freq: Three times a day (TID) | OPHTHALMIC | 0 refills | Status: DC

## 2016-06-11 MED ORDER — PREDNISOLONE ACETATE 1 % OP SUSP: 1.0000 [drp] | Freq: Four times a day (QID) | OPHTHALMIC | 0 refills | Status: DC

## 2016-06-11 MED ORDER — BRIMONIDINE TARTRATE 0.2 % OP SOLN: 1.0000 [drp] | Freq: Two times a day (BID) | OPHTHALMIC | 12 refills | Status: DC

## 2016-06-11 MED ORDER — GATIFLOXACIN 0.5 % OP SOLN: 1.0000 [drp] | Freq: Four times a day (QID) | OPHTHALMIC | Status: DC

## 2016-06-11 NOTE — Discharge Summary (Signed)
Discharge summary not needed on [facility] patients per medical records. 

## 2016-06-11 NOTE — Progress Notes (Signed)
Patient discharged to home accompanied by husband.  Explained discharged instructions including ophthalmic medications with dosage instructions. Patient and husband verbalized understanding and question clarified.  Hand written medication prescription was handed by Dr. Collins Scotland.

## 2016-06-11 NOTE — Progress Notes (Signed)
06/11/2016, 6:36 AM  Mental Status:  Awake, Alert, Oriented  Anterior segment: Cornea  Clear    Anterior Chamber Clear    Lens:   Clear   Intra Ocular Pressure 22 mmHg with Tonopen  Vitreous: Clear  Retina:  Attached Good laser reaction   Impression: Excellent result Retina attached   Final Diagnosis: Principal Problem:   Retinal break of left eye Active Problems:   Rhegmatogenous retinal detachment of right eye   Plan: start post operative eye drops.  Add Alphagan.  Discharge to home.  Give post operative instructions  Hayden Pedro 06/11/2016, 6:36 AM

## 2016-06-11 NOTE — Op Note (Signed)
NAMESEMIYA, Ruth Williams          ACCOUNT NO.:  [id number]  MEDICAL RECORD NO.:  [id number]  LOCATION:  6N20C                        FACILITY:  [facility]  PHYSICIAN:  Chrystie Nose. Zigmund Daniel, M.D. DATE OF BIRTH:  [birth year]  DATE OF PROCEDURE:  06/10/2016 DATE OF DISCHARGE:                              OPERATIVE REPORT   ADMISSION DIAGNOSES:  Retinal break, left eye and rhegmatogenous retinal detachment, right eye.  SURGEON:  Tempie Hoist, MD  ASSISTANT:  Deatra Ina, SA  ANESTHESIA:  General.  DESCRIPTION OF PROCEDURE:  After proper endotracheal anesthesia, the indirect ophthalmoscope laser was moved into place.  A 196 burns were placed around the retinal break at 8 o'clock in the left eye. Polysporin ointment was placed and the eye was closed and covered. Attention was then carried to the right eye where the usual prep and drape was performed, 360 degree limbal peritomy, isolation of 4 rectus muscles on 2-0 silk, scleral dissection from 9 o'clock to admit a #279 intrascleral implant with 2 mm trimmed from the posterior edge. Diathermy placed in the bed.  The 279 implant was placed against the globe.  Five 4-0 Mersilene sutures were placed in the scleral flaps.  A 240 band was placed around the eye with a 270 sleeve at 2 o'clock.  Belt loops were placed at 11 o'clock, 1 o'clock, and at 3 o'clock. Perforation site was chosen at 8 o'clock.  After diathermy of the sclera and the choroid, a diathermy perforation was used to gain access to the subretinal space.  The subretinal fluid was drained carefully and slowly.  It was clear and colorless and thin.  When the fluid stopped, the scleral elements were placed and the scleral flaps were closed.  The band was adjusted and trimmed.  The buckle was adjusted and trimmed. Indirect ophthalmoscopy showed the retina to be lying nicely in place on the scleral buckle with no subretinal fluid remaining.  The indirect ophthalmoscope laser was moved  into place and 296 burns were placed on the scleral buckle in the areas of recently reattached retina.  The break was surrounded at 6 o'clock.  The power was 460 mW, 1000 microns each and 0.1 seconds each.  The conjunctiva was reapproximated with 7-0 chromic suture.  Polymyxin and ceftazidime were rinsed around the globe for antibiotic coverage.  Decadron 10 mg was injected into the lower subconjunctival space.  Atropine solution was applied.  Marcaine was injected around the globe for postop pain.  Paracentesis x1 obtained to closing tension of 10 with a Barraquer tonometer.  Polysporin ophthalmic ointment, patch, and shield were placed.  The patient was awakened and taken to recovery in satisfactory condition.  COMPLICATIONS:  None.  DURATION:  2 hours.     Chrystie Nose. Zigmund Daniel, M.D.     JDM/MEDQ  D:  06/10/2016  T:  06/11/2016  Job:  [id number]

## 2016-06-17 ENCOUNTER — Ambulatory Visit (INDEPENDENT_AMBULATORY_CARE_PROVIDER_SITE_OTHER): Payer: BLUE CROSS/BLUE SHIELD | Admitting: Ophthalmology

## 2016-06-18 ENCOUNTER — Ambulatory Visit (INDEPENDENT_AMBULATORY_CARE_PROVIDER_SITE_OTHER): Payer: BLUE CROSS/BLUE SHIELD | Admitting: Ophthalmology

## 2016-06-18 DIAGNOSIS — H338 Other retinal detachments: Secondary | ICD-10-CM

## 2016-07-10 ENCOUNTER — Encounter (INDEPENDENT_AMBULATORY_CARE_PROVIDER_SITE_OTHER): Payer: BLUE CROSS/BLUE SHIELD | Admitting: Ophthalmology

## 2016-07-10 DIAGNOSIS — H338 Other retinal detachments: Secondary | ICD-10-CM

## 2016-07-24 NOTE — Anesthesia Postprocedure Evaluation (Signed)
Anesthesia Post Note  Patient: Ruth Williams  Procedure(s) Performed: Procedure(s) (LRB): PHOTOCOAGULATION WITH LASER - HEADSCOPE LASER (Bilateral) SCLERAL BUCKLE (Right) AIR/FLUID EXCHANGE (Right)  Patient location during evaluation: PACU Anesthesia Type: General Level of consciousness: awake and alert Pain management: pain level controlled Vital Signs Assessment: post-procedure vital signs reviewed and stable Respiratory status: spontaneous breathing, nonlabored ventilation, respiratory function stable and patient connected to nasal cannula oxygen Cardiovascular status: blood pressure returned to baseline and stable Postop Assessment: no signs of nausea or vomiting Anesthetic complications: no    Last Vitals:  Vitals:   06/10/16 2025 06/11/16 0458  BP: 128/75 110/64  Pulse: 89 76  Resp: 18 18  Temp: 36.5 C 36.6 C    Last Pain:  Vitals:   06/11/16 0458  TempSrc: Oral  PainSc:                  Jacquel Mccamish,JAMES TERRILL

## 2016-09-16 ENCOUNTER — Encounter (INDEPENDENT_AMBULATORY_CARE_PROVIDER_SITE_OTHER): Payer: BLUE CROSS/BLUE SHIELD | Admitting: Ophthalmology

## 2016-09-16 DIAGNOSIS — H33302 Unspecified retinal break, left eye: Secondary | ICD-10-CM

## 2016-09-16 DIAGNOSIS — H338 Other retinal detachments: Secondary | ICD-10-CM

## 2016-09-16 DIAGNOSIS — H43813 Vitreous degeneration, bilateral: Secondary | ICD-10-CM | POA: Diagnosis not present

## 2017-01-28 ENCOUNTER — Other Ambulatory Visit: Payer: Self-pay | Admitting: Family Medicine

## 2017-03-17 IMAGING — DX DG HIP (WITH OR WITHOUT PELVIS) 1V*R*
3 series · 3 of 3 positions shown · non-contrast
Comparison: None.

CLINICAL DATA: Chronic right hip pain for months without known
injury.

EXAM:
DG HIP (WITH OR WITHOUT PELVIS) 1V RIGHT

[pelvis ap]
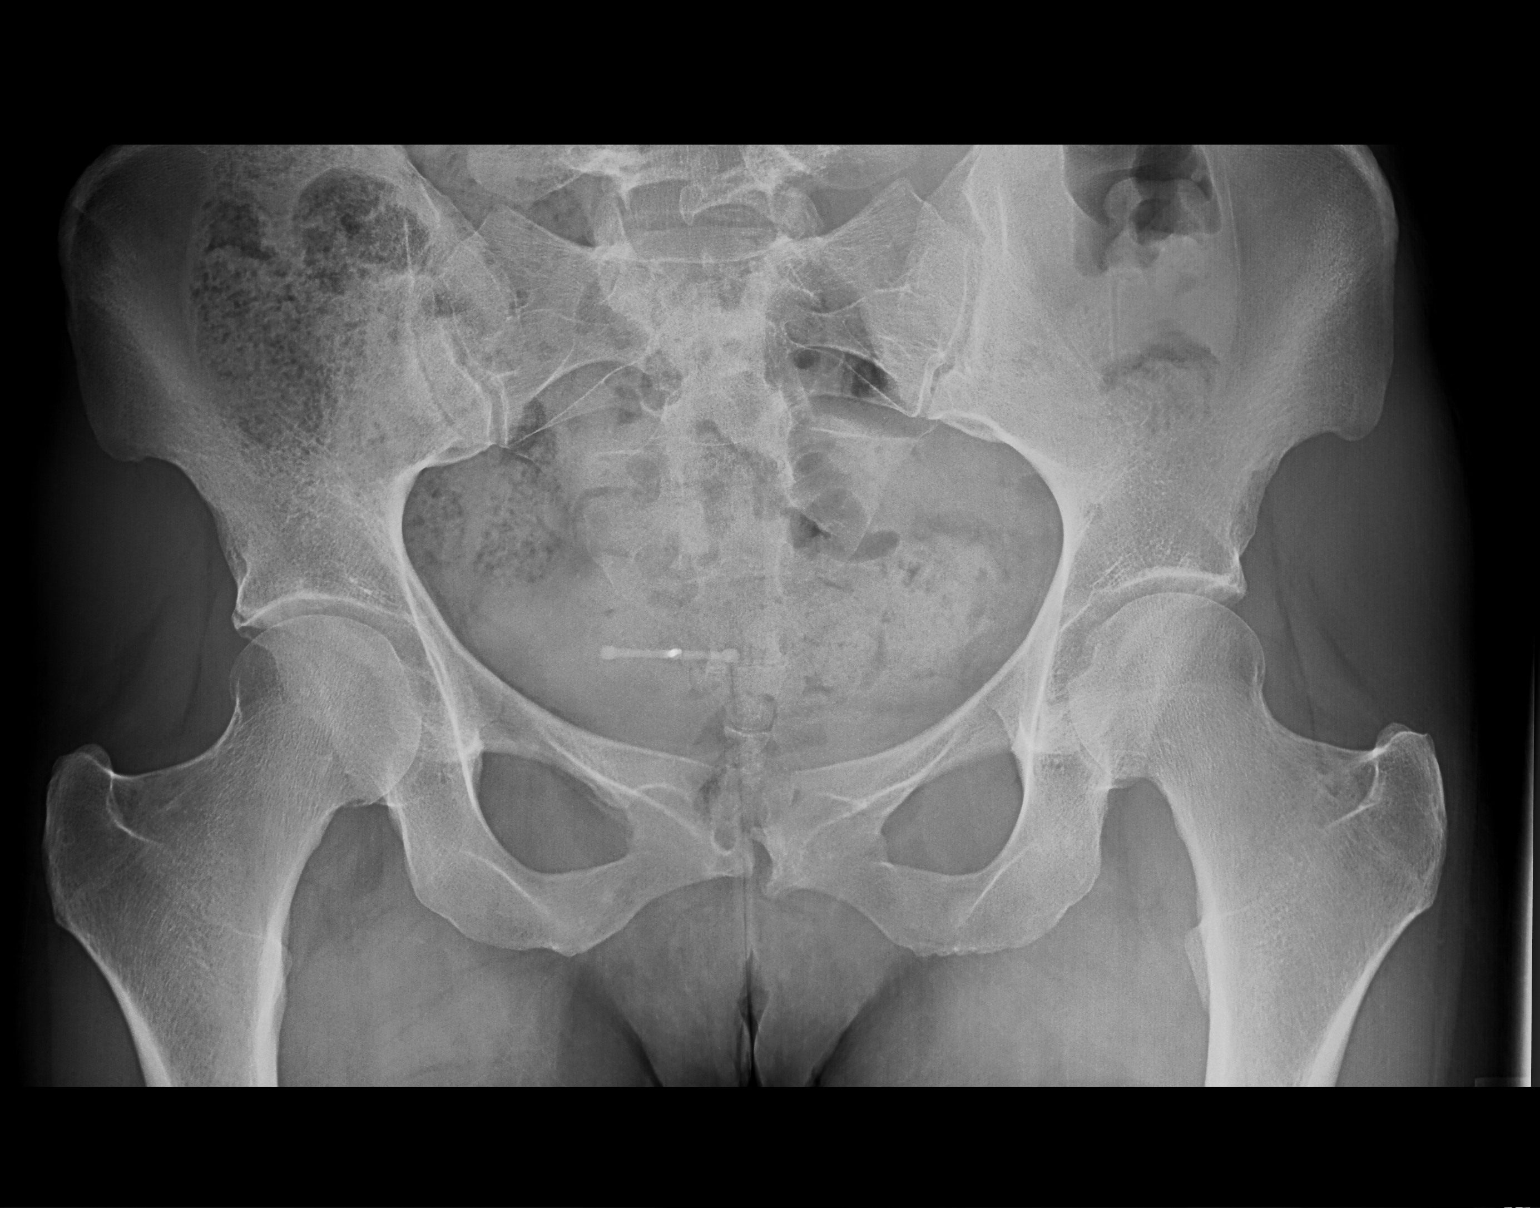

[hip joint ap]
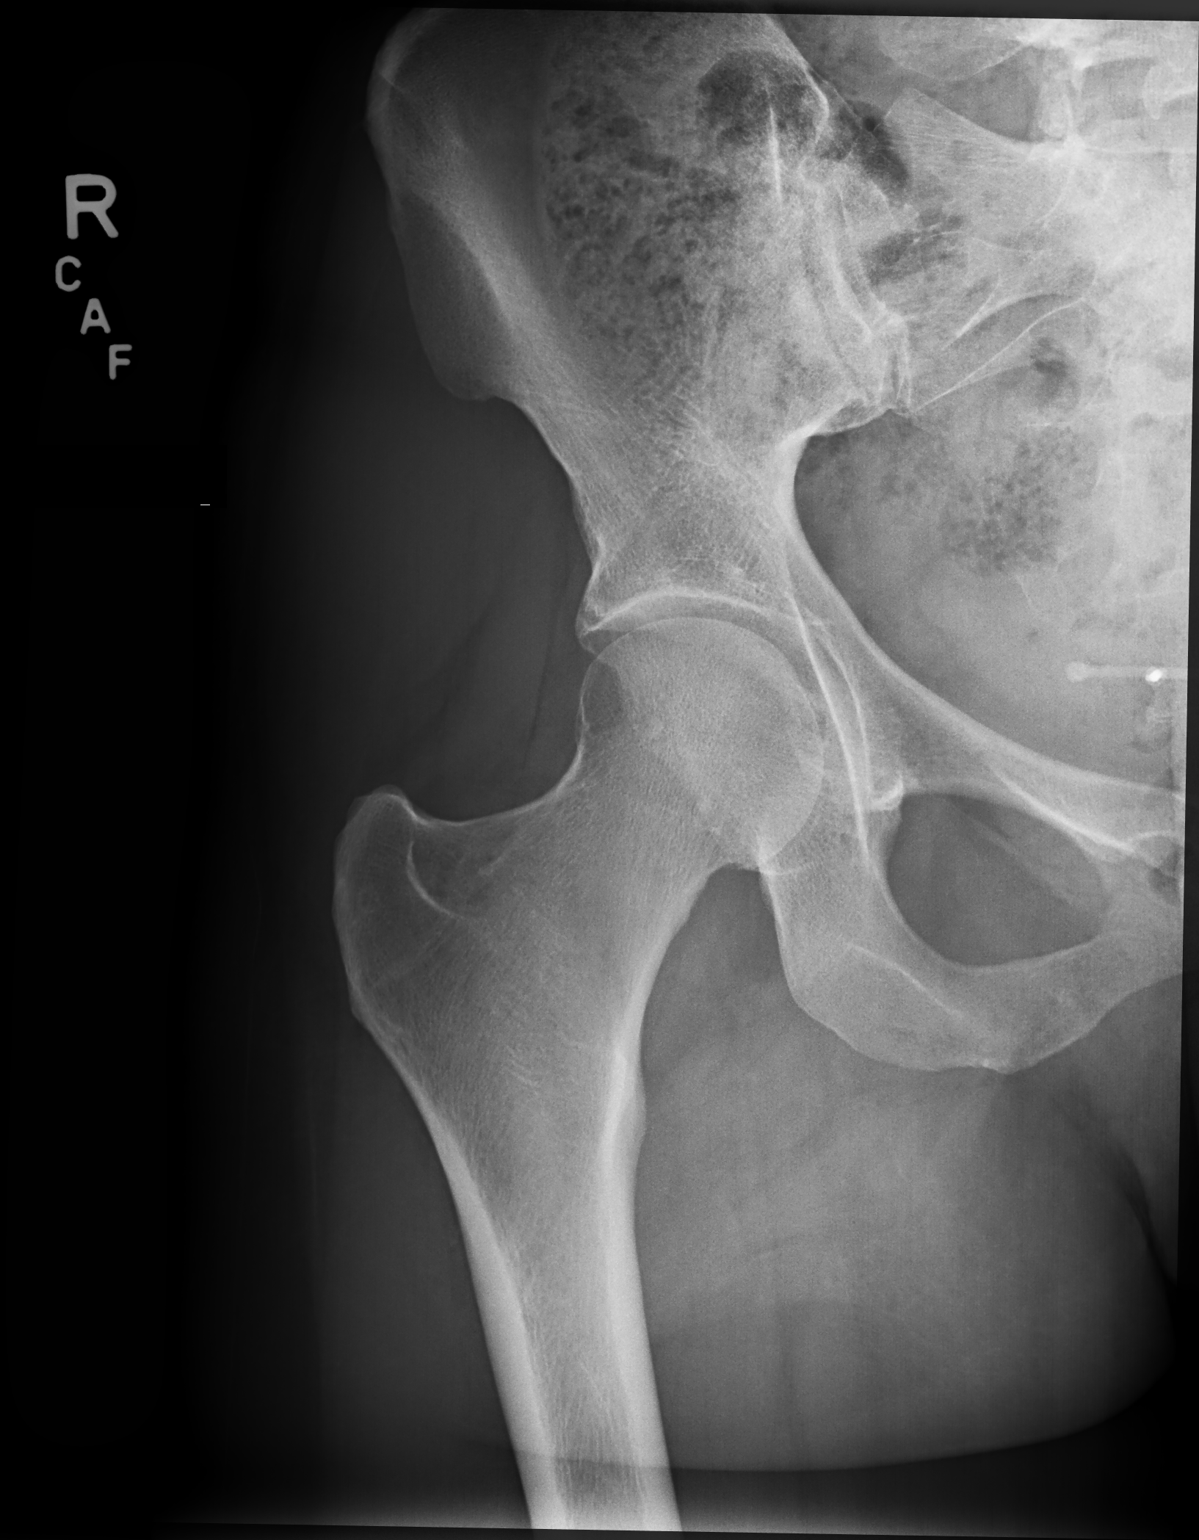

[ap frog obl (oblique)]
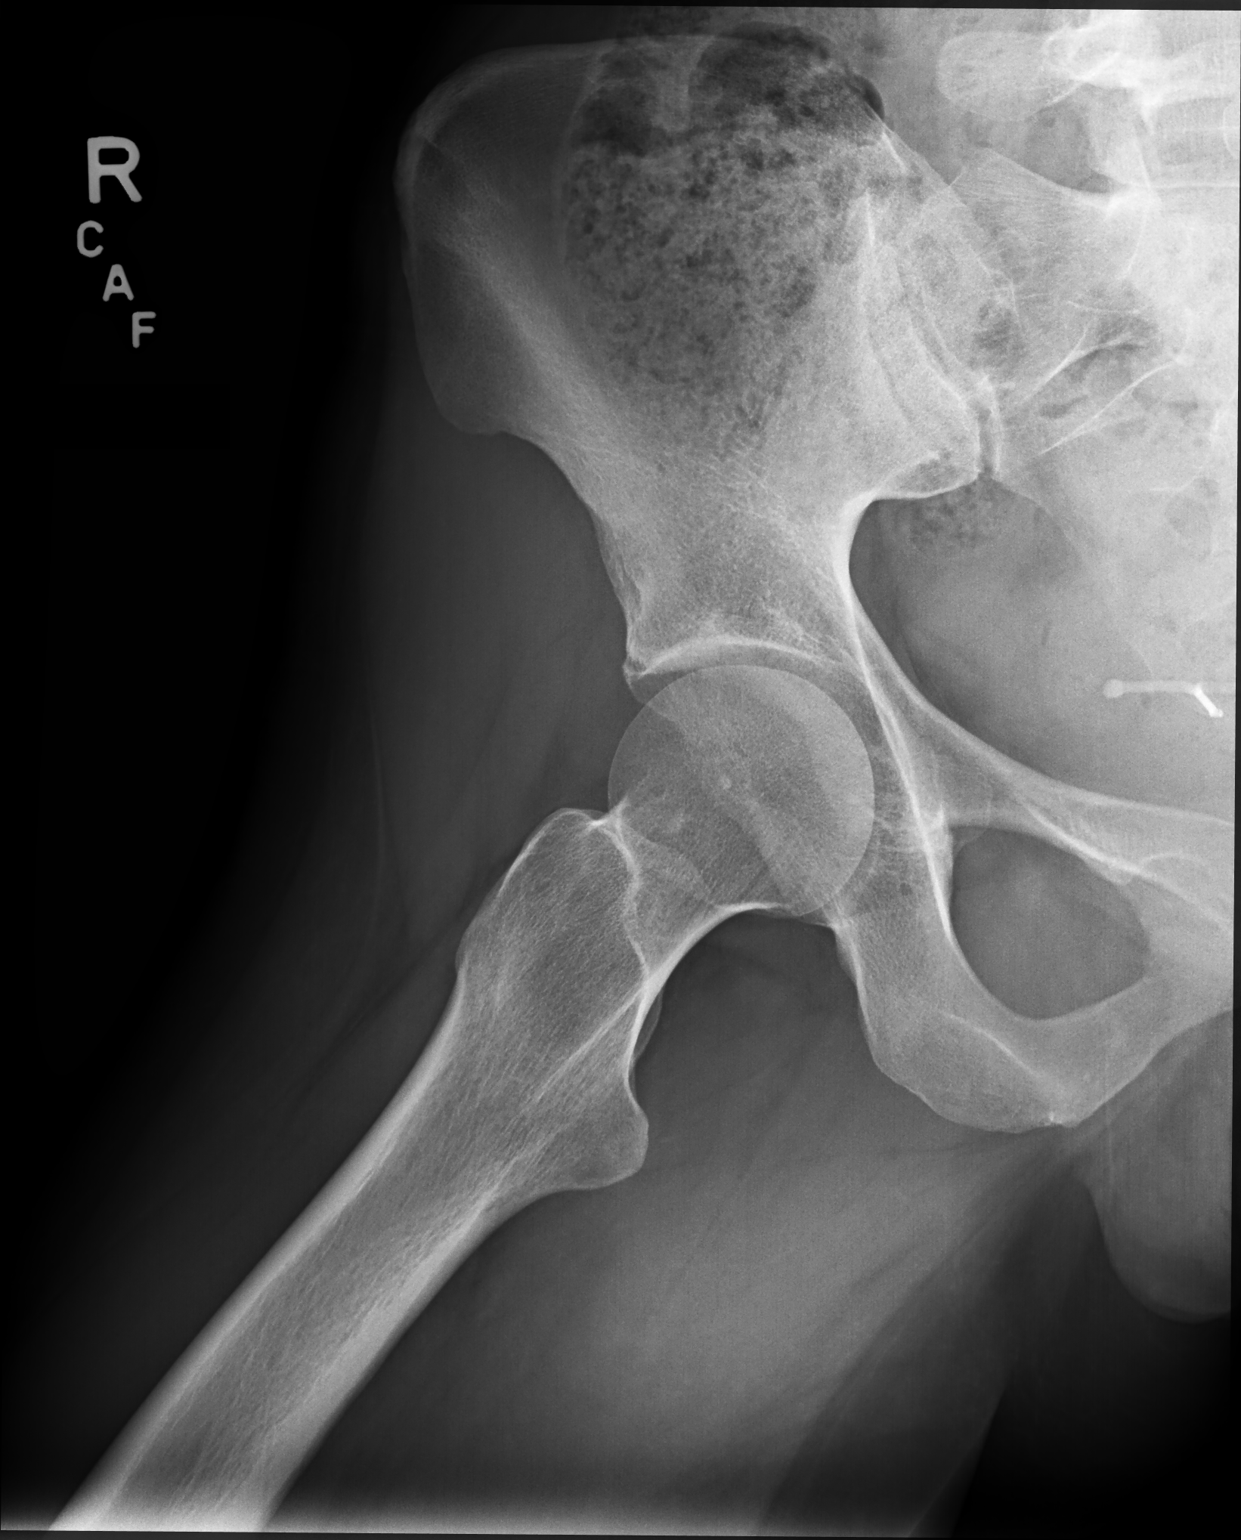

[3 of 3 positions shown; findings below may reference images not displayed]

FINDINGS: There is no evidence of hip fracture or dislocation. There is no
evidence of arthropathy or other focal bone abnormality.
IMPRESSION: Normal right hip.

## 2017-07-30 DIAGNOSIS — C4491 Basal cell carcinoma of skin, unspecified: Secondary | ICD-10-CM

## 2017-07-30 HISTORY — DX: Basal cell carcinoma of skin, unspecified: C44.91

## 2017-08-13 HISTORY — PX: MOHS SURGERY: SUR867

## 2017-12-21 ENCOUNTER — Telehealth: Payer: Self-pay | Admitting: Certified Nurse Midwife

## 2017-12-21 ENCOUNTER — Encounter: Payer: Self-pay | Admitting: Certified Nurse Midwife

## 2017-12-21 ENCOUNTER — Ambulatory Visit (INDEPENDENT_AMBULATORY_CARE_PROVIDER_SITE_OTHER): Payer: BLUE CROSS/BLUE SHIELD | Admitting: Certified Nurse Midwife

## 2017-12-21 VITALS — BP 98/58 | HR 83 | Ht 68.0 in | Wt 141.0 lb

## 2017-12-21 DIAGNOSIS — Z800 Family history of malignant neoplasm of digestive organs: Secondary | ICD-10-CM

## 2017-12-21 DIAGNOSIS — Z124 Encounter for screening for malignant neoplasm of cervix: Secondary | ICD-10-CM | POA: Diagnosis not present

## 2017-12-21 DIAGNOSIS — Z01419 Encounter for gynecological examination (general) (routine) without abnormal findings: Secondary | ICD-10-CM

## 2017-12-21 DIAGNOSIS — Z309 Encounter for contraceptive management, unspecified: Secondary | ICD-10-CM

## 2017-12-21 NOTE — Progress Notes (Signed)
Gynecology Annual Exam  PCP: Coral Spikes, DO  Chief Complaint:  Chief Complaint  Patient presents with  . Gynecologic Exam    History of Present Illness:Ruth Williams is a 37 year old Caucasian/White female, G3 P2012, who presents for her annual exam. She is having no significant GYN problems.  Her menses are lite, just spotting, each month,  and last 3 days due to the Mirena IUD.   She denies dysmenorrhea. Feels bloated around the time of her menses  The patient's past medical history is remarkable for a history of a detached retina on the right and retinal tears on the left-treated successfully with laser. Since her last annual exam 06/25/2016, she has had Moh's surgery for [facility] at the bridge of her nose. In addition her father recently died at age 106 from bladder and colon cancer. Father had some type of testing done that places patient at higher risk of cancer?. Encouraged to get info from step mother so that patient can be tested for this mutation. Patient definitely needs earlier screening for colon cancer.  She is sexually active. She is currently using an IUD for contraception. Mirena IUD was placed on 12/13/2012. She is thinking of getting it replaced. Her most recent pap smear was obtained 06/25/2016 and was NIL/negative HRHPV.  Mammogram is not applicable.  There is no family history of breast cancer.  There is no family history of ovarian cancer.  The patient does do occasional self breast exams.  The patient does not smoke.  The patient does drinks alcohol occasionally.  The patient does not use illegal drugs.  The patient joined a gym in January and has been exercising 4x/week. The patient does get adequate calcium in her diet.  She had a recent cholesterol screen in 2017 that was normal.      Review of Systems: Review of Systems  Constitutional: Negative for chills, fever and weight loss.  HENT: Negative for congestion, sinus pain and sore throat.     Eyes: Negative for blurred vision and pain.  Respiratory: Negative for hemoptysis, shortness of breath and wheezing.   Cardiovascular: Negative for chest pain, palpitations and leg swelling.  Gastrointestinal: Negative for abdominal pain, blood in stool, diarrhea, heartburn, nausea and vomiting.  Genitourinary: Negative for dysuria, frequency, hematuria and urgency.  Musculoskeletal: Negative for back pain, joint pain and myalgias.  Skin: Negative for itching and rash.  Neurological: Negative for dizziness, tingling and headaches.  Endo/Heme/Allergies: Negative for environmental allergies and polydipsia. Does not bruise/bleed easily.       Negative for hirsutism   Psychiatric/Behavioral: Negative for depression. The patient is not nervous/anxious and does not have insomnia.     Past Medical History:  Past Medical History:  Diagnosis Date  . Basal cell carcinoma   . Chicken pox   . Complication of anesthesia    Reports that time she was put to sleep reports that she woke up "crazy"  . Detached retina 05/2016  . Migraines   . SAB (spontaneous abortion) 07/13/2009    Past Surgical History:  Past Surgical History:  Procedure Laterality Date  . AIR/FLUID EXCHANGE Right 06/10/2016   Procedure: AIR/FLUID EXCHANGE;  Surgeon: Hayden Pedro, MD;  Location: [place];  Service: Ophthalmology;  Laterality: Right;  . BREAST ENHANCEMENT SURGERY  07/13/2014  . MOHS SURGERY  08/2017   basal cell carcinoma removal from nose  . PHOTOCOAGULATION WITH LASER Bilateral 06/10/2016   Procedure: PHOTOCOAGULATION WITH LASER - HEADSCOPE  LASER;  Surgeon: Hayden Pedro, MD;  Location: [place];  Service: Ophthalmology;  Laterality: Bilateral;  . SCLERAL BUCKLE Right 06/10/2016   Procedure: SCLERAL BUCKLE;  Surgeon: Hayden Pedro, MD;  Location: [place];  Service: Ophthalmology;  Laterality: Right;    Family History:  Family History  Problem Relation Age of Onset  . Mental illness Mother   . Hypertension  Mother   . Colon cancer Father 46  . Bladder Cancer Father 43  . Diabetes Maternal Grandmother   . Throat cancer Paternal Grandfather   . Stomach cancer Maternal Aunt 30    Social History:  Social History   Socioeconomic History  . Marital status: Married    Spouse name: Not on file  . Number of children: 2  . Years of education: Not on file  . Highest education level: Not on file  Social Needs  . Financial resource strain: Not on file  . Food insecurity - worry: Not on file  . Food insecurity - inability: Not on file  . Transportation needs - medical: Not on file  . Transportation needs - non-medical: Not on file  Occupational History  . Occupation: Homemaker  Tobacco Use  . Smoking status: Never Smoker  . Smokeless tobacco: Never Used  Substance and Sexual Activity  . Alcohol use: Yes    Comment: occasional  . Drug use: No  . Sexual activity: Yes    Partners: Female    Birth control/protection: IUD  Other Topics Concern  . Not on file  Social History Narrative  . Not on file    Allergies:  Allergies  Allergen Reactions  . No Known Allergies     Medications:  Current Outpatient Medications on File Prior to Visit  Medication Sig Dispense Refill  . ibuprofen (ADVIL,MOTRIN) 200 MG tablet Take 200 mg by mouth daily as needed for fever (pain).    Marland Kitchen levonorgestrel (MIRENA) 20 MCG/24HR IUD 1 each by Intrauterine route once. Implanted March 2014     No current facility-administered medications on file prior to visit.   Physical Exam Vitals: BP (!) 98/58   Pulse 83   Ht 5\' 8"  (1.727 m)   Wt 141 lb (64 kg)   BMI 21.44 kg/m   General: WF in NAD HEENT: normocephalic, anicteric Neck: no thyroid enlargement, no palpable nodules, no cervical lymphadenopathy  Pulmonary: No increased work of breathing, CTAB Cardiovascular: RRR, without murmur  Breast: Breast implants present, no tenderness, no palpable nodules or masses, no skin or nipple retraction present, no  nipple discharge.  No axillary, infraclavicular or supraclavicular lymphadenopathy. Abdomen: Soft, non-tender, non-distended.  Umbilicus without lesions.  No hepatomegaly or masses palpable. No evidence of hernia. Genitourinary:  External: Normal external female genitalia.  Normal urethral meatus, normal Bartholin's and Skene's glands.    Vagina: Normal vaginal mucosa, no evidence of prolapse, moderate white discharge  Cervix: Grossly normal in appearance, no bleeding, non-tender, IUD strings visible  Uterus: Anteflexed, normal size, shape, and consistency, mobile, and non-tender  Adnexa: No adnexal masses, non-tender  Rectal: deferred  Lymphatic: no evidence of inguinal lymphadenopathy Extremities: no edema, erythema, or tenderness Neurologic: Grossly intact Psychiatric: mood appropriate, affect full  Wet prep: negative for Trich, clue cells or hyphae   Assessment: 37 y.o. normal annual gyn exam   Plan:    1) Breast cancer screening - recommend monthly self breast exam and starting annual screening mammograms at age 78   2) Cervical cancer screening - Pap was done.  3) Contraception -Mirena due for replacement. Discussed alternatives. Will return for IUD replacement in the next month.  4) Routine healthcare maintenance including cholesterol and diabetes screening UTD  Dalia Heading, CNM

## 2017-12-21 NOTE — Telephone Encounter (Signed)
Patient scheduled 3/27 for mirena replacement with [facility]

## 2017-12-23 LAB — IGP,RFX APTIMA HPV ALL PTH: PAP SMEAR COMMENT: 0

## 2017-12-25 NOTE — Telephone Encounter (Signed)
Noted. Will order to arrive by apt date/time. 

## 2017-12-28 ENCOUNTER — Encounter: Payer: Self-pay | Admitting: Certified Nurse Midwife

## 2017-12-28 DIAGNOSIS — Z800 Family history of malignant neoplasm of digestive organs: Secondary | ICD-10-CM | POA: Insufficient documentation

## 2018-01-01 NOTE — Telephone Encounter (Signed)
Mirena reserved

## 2018-01-06 ENCOUNTER — Encounter: Payer: Self-pay | Admitting: Certified Nurse Midwife

## 2018-01-06 ENCOUNTER — Ambulatory Visit (INDEPENDENT_AMBULATORY_CARE_PROVIDER_SITE_OTHER): Payer: BLUE CROSS/BLUE SHIELD | Admitting: Certified Nurse Midwife

## 2018-01-06 VITALS — BP 98/58 | HR 77 | Ht 69.0 in | Wt 138.0 lb

## 2018-01-06 DIAGNOSIS — Z30433 Encounter for removal and reinsertion of intrauterine contraceptive device: Secondary | ICD-10-CM

## 2018-01-06 NOTE — Progress Notes (Signed)
    GYNECOLOGY OFFICE PROCEDURE NOTE  Ruth Williams is a 37 y.o. [id number] here for Mirena IUD replacement. No GYN concerns.  Last pap smear was on 12/21/2017 and was normal. Her current IUD was inserted 5 years ago, and she has had no problems with it.  IUD Procedure Note Patient identified, informed consent performed, consent signed.   Discussed risks of irregular bleeding, cramping, infection, expulsion,malpositioning or misplacement of the IUD outside the uterus which may require further procedure such as laparoscopy. Time out was performed.   On bimanual exam, uterus was Anteverted. Speculum placed in the vagina.  Cervix visualized. A little difficulty initially seeing the IUD strings. They were identified and grasped with a packing forceps and the IUD was removed intact without difficulty. The cervix was then prepped with Betadine x 3. Cervix was sprayed with Hurricaine anesthetic and  grasped anteriorly with a single tooth tenaculum.  Uterus sounded to 7 cm. The Mirena  IUD placed per manufacturer's recommendations.  Strings trimmed to 3 cm. Tenaculum was removed, and silver nitrate was applied to tenaculum sites for hemostasis.  Patient tolerated procedure well. [id number] from tenaculum sites.   Patient was given post-procedure instructions.  Patient was also asked to check IUD strings periodically and follow up in 4 weeks for IUD check.  Dalia Heading, CNM 01/06/18

## 2018-01-08 NOTE — Telephone Encounter (Signed)
Ruth Williams received & charged 01/06/18

## 2018-01-28 DIAGNOSIS — D225 Melanocytic nevi of trunk: Secondary | ICD-10-CM | POA: Diagnosis not present

## 2018-01-28 DIAGNOSIS — Z85828 Personal history of other malignant neoplasm of skin: Secondary | ICD-10-CM | POA: Diagnosis not present

## 2018-01-28 DIAGNOSIS — D229 Melanocytic nevi, unspecified: Secondary | ICD-10-CM | POA: Diagnosis not present

## 2018-01-28 DIAGNOSIS — B078 Other viral warts: Secondary | ICD-10-CM | POA: Diagnosis not present

## 2018-02-10 ENCOUNTER — Ambulatory Visit: Payer: BLUE CROSS/BLUE SHIELD | Admitting: Certified Nurse Midwife

## 2018-03-09 ENCOUNTER — Ambulatory Visit: Payer: BLUE CROSS/BLUE SHIELD | Admitting: Certified Nurse Midwife

## 2018-08-05 DIAGNOSIS — Z1283 Encounter for screening for malignant neoplasm of skin: Secondary | ICD-10-CM | POA: Diagnosis not present

## 2018-08-05 DIAGNOSIS — Z85828 Personal history of other malignant neoplasm of skin: Secondary | ICD-10-CM | POA: Diagnosis not present

## 2018-08-05 DIAGNOSIS — D229 Melanocytic nevi, unspecified: Secondary | ICD-10-CM | POA: Diagnosis not present

## 2018-08-05 DIAGNOSIS — L814 Other melanin hyperpigmentation: Secondary | ICD-10-CM | POA: Diagnosis not present

## 2018-08-05 DIAGNOSIS — L820 Inflamed seborrheic keratosis: Secondary | ICD-10-CM | POA: Diagnosis not present

## 2019-04-08 ENCOUNTER — Encounter: Payer: Self-pay | Admitting: Internal Medicine

## 2019-04-08 ENCOUNTER — Other Ambulatory Visit: Payer: Self-pay

## 2019-04-08 ENCOUNTER — Ambulatory Visit (INDEPENDENT_AMBULATORY_CARE_PROVIDER_SITE_OTHER): Payer: BC Managed Care – PPO | Admitting: Internal Medicine

## 2019-04-08 DIAGNOSIS — E559 Vitamin D deficiency, unspecified: Secondary | ICD-10-CM | POA: Diagnosis not present

## 2019-04-08 DIAGNOSIS — Z0000 Encounter for general adult medical examination without abnormal findings: Secondary | ICD-10-CM

## 2019-04-08 DIAGNOSIS — K9041 Non-celiac gluten sensitivity: Secondary | ICD-10-CM | POA: Diagnosis not present

## 2019-04-08 DIAGNOSIS — Z1329 Encounter for screening for other suspected endocrine disorder: Secondary | ICD-10-CM

## 2019-04-08 DIAGNOSIS — Z13220 Encounter for screening for lipoid disorders: Secondary | ICD-10-CM

## 2019-04-08 DIAGNOSIS — R070 Pain in throat: Secondary | ICD-10-CM

## 2019-04-08 DIAGNOSIS — R682 Dry mouth, unspecified: Secondary | ICD-10-CM

## 2019-04-08 DIAGNOSIS — C4491 Basal cell carcinoma of skin, unspecified: Secondary | ICD-10-CM | POA: Insufficient documentation

## 2019-04-08 DIAGNOSIS — C44311 Basal cell carcinoma of skin of nose: Secondary | ICD-10-CM

## 2019-04-08 DIAGNOSIS — Z1389 Encounter for screening for other disorder: Secondary | ICD-10-CM

## 2019-04-08 NOTE — Progress Notes (Signed)
Virtual Visit via Video Note  I connected with Ruth Williams   on 04/08/19 at  2:10 PM EDT by a video enabled telemedicine application and verified that I am speaking with the correct person using two identifiers.  Location patient: home Location provider:work Persons participating in the virtual visit: patient, provider  I discussed the limitations of evaluation and management by telemedicine and the availability of in person appointments. The patient expressed understanding and agreed to proceed.   HPI: 1. Pt c/o dry mouth/throat and burning in her throat x 1 month tried gum chewing w/o help. She almost feels like throat is irritated and wonders if allergy related but she currently does not take medications for allergies. At times throat feels like it is burning. She always feels like she has to brush her teeth due to bad breath and eating dairy, sugar, drinking sprite makes worse. Denies GERD, trouble swallowing throat always feels "dry"  2. FH cancer and was told to get genetic tested for HER2 will consider in future  3. 1/2 sister with Celiac dx and she wonders if she has this as well    ROS: See pertinent positives and negatives per HPI. General: wt stable  HEENT: dry throat, denies dry eyes CV: no chest pain  Lungs: no sob  Ab: no ab pain  MSK no joint pain  Neuro: no h/a/dizziness  Skin: no skin lesion  Psych: no anxiety/depression   Past Medical History:  Diagnosis Date  . Allergy    seasonal  . Basal cell carcinoma    right nose s/p mohs in 2018 Dr. Laurence Ferrari is dermatologist  . Bleeding from the nose   . Chicken pox   . Complication of anesthesia    Reports that time she was put to sleep reports that she woke up "crazy"  . Detached retina 05/2016   right  . Migraines   . SAB (spontaneous abortion) 07/13/2009  . Shingles    x2    Past Surgical History:  Procedure Laterality Date  . AIR/FLUID EXCHANGE Right 06/10/2016   Procedure: AIR/FLUID EXCHANGE;  Surgeon:  Hayden Pedro, MD;  Location: [place];  Service: Ophthalmology;  Laterality: Right;  . BREAST ENHANCEMENT SURGERY  07/13/2014  . MOHS SURGERY  08/2017   basal cell carcinoma removal from nose  . PHOTOCOAGULATION WITH LASER Bilateral 06/10/2016   Procedure: PHOTOCOAGULATION WITH LASER - HEADSCOPE LASER;  Surgeon: Hayden Pedro, MD;  Location: [place];  Service: Ophthalmology;  Laterality: Bilateral;  . RETINAL DETACHMENT SURGERY     right eye  . SCLERAL BUCKLE Right 06/10/2016   Procedure: SCLERAL BUCKLE;  Surgeon: Hayden Pedro, MD;  Location: [place];  Service: Ophthalmology;  Laterality: Right;    Family History  Problem Relation Age of Onset  . Mental illness Mother   . Hypertension Mother   . COPD Mother        smoker died age 51  . Colon cancer Father 30  . Bladder Cancer Father 73       bladder to colon her2+ died age 31   . Diabetes Maternal Grandmother   . Throat cancer Paternal Grandfather   . Pancreatic cancer Paternal Grandfather   . Stomach cancer Maternal Aunt 13  . Celiac disease Half-Sister     SOCIAL HX:  1 daughter 70 y.o, 1 son 28 y.o as of 04/08/19 Husband Aaron Edelman Susan [phone]   Current Outpatient Medications:  .  ibuprofen (ADVIL,MOTRIN) 200 MG tablet, Take 200  mg by mouth daily as needed for fever (pain)., Disp: , Rfl:  .  levonorgestrel (MIRENA) 20 MCG/24HR IUD, 1 each by Intrauterine route once. Implanted March 2014, Disp: , Rfl:   EXAM:  VITALS per patient if applicable:  GENERAL: alert, oriented, appears well and in no acute distress  HEENT: atraumatic, conjunttiva clear, no obvious abnormalities on inspection of external nose and ears  NECK: normal movements of the head and neck  LUNGS: on inspection no signs of respiratory distress, breathing rate appears normal, no obvious gross SOB, gasping or wheezing  CV: no obvious cyanosis  MS: moves all visible extremities without noticeable abnormality  PSYCH/NEURO: pleasant and  cooperative, no obvious depression or anxiety, speech and thought processing grossly intact  ASSESSMENT AND PLAN:  Discussed the following assessment and plan:  Gluten intolerance - Plan: Gliadin antibodies, serum, Tissue transglutaminase, IgA, Reticulin Antibody, IgA w reflex titer  Dry mouth - Plan: Sjogrens syndrome-A extractable nuclear antibody, Sjogrens syndrome-B extractable nuclear antibody, Antinuclear Antib (ANA), Ambulatory referral to ENT Dr. Tami Ribas or Vaught to further w/u   Burning sensation of throat - Plan: Ambulatory referral to ENT,see above   Basal cell carcinoma (BCC) of skin of nose - Plan: f/u derm yearly has seen w/in the last 1 year   HM Flu utd  Tdap utd in 2013  Pap [facility] 12/21/17 neg no HPV testing done   F/u eye MD  F/u dermatology Dr. Laurence Ferrari s/p [facility] right nose s/p mohs in 2018  Dad FH bladder cancer mets to colon HER2  + and was told to get genetic testing she will consider in future   I discussed the assessment and treatment plan with the patient. The patient was provided an opportunity to ask questions and all were answered. The patient agreed with the plan and demonstrated an understanding of the instructions.   The patient was advised to call back or seek an in-person evaluation if the symptoms worsen or if the condition fails to improve as anticipated.  Time spent 25 minutes   Delorise Jackson, MD

## 2019-04-08 NOTE — Patient Instructions (Signed)
  Biotene mouth wash for dry mouth   Halitosis Halitosis is bad breath. Halitosis may be caused by:  Foods and beverages.  Poor mouth care (oral hygiene).  Medical conditions, such as sinus infections, mouth infections, diabetes, and liver or kidney disease.  Medicines that dry out your mouth.  Smoking. Follow these instructions at home: Oral hygiene         Floss at least once a day. Ask your dentist to show you the best way to floss.  Brush your teeth at least two times a day. Use the toothpaste that your dentist recommends. Ask your dentist to show you the best way to brush your teeth.  Brush your tongue when you brush your teeth. This may help to improve your breath.  Rinse your mouth at least once a day. Use the mouthwash that your dentist recommends.  Visit your dentist for a routine cleaning at least twice a year. Eating and drinking  Drink enough fluid to keep your urine pale yellow.  Eat foods that help to keep your teeth clean, such as carrots and celery.  Avoid foods and drinks that can lead to bad breath, such as: ? Garlic. ? Onions. ? Fish. ? Meats. ? Coffee. ? Alcohol. General instructions  Do not use any products that contain nicotine or tobacco, such as cigarettes and e-cigarettes. These products can make bad breath worse. If you need help quitting, ask your health care provider.  If you wear mouth devices such as dentures or a retainer, make sure you wear and clean your device properly.  If you have a dry mouth, try chewing gum or mints that do not contain sugar. Chewing gum or sucking on mints can trigger saliva production. Contact a health care provider if:  You develop new symptoms, such as bleeding gums or pain.  Your symptoms get worse or do not improve with home care. Summary  Halitosis is bad breath. This has many possible causes, such as certain foods and beverages.  Make sure you have good oral hygiene. Also keep any oral devices,  such as retainers, clean.  Drink enough fluid to keep your urine pale yellow.  Avoid foods and drinks that can lead to bad breath, such as garlic and onions.  Do not use any products that contain nicotine or tobacco, such as cigarettes and e-cigarettes. This information is not intended to replace advice given to you by your health care provider. Make sure you discuss any questions you have with your health care provider. Document Released: 11/06/2004 Document Revised: 10/01/2017 Document Reviewed: 07/14/2017 Elsevier Patient Education  2020 [employer].

## 2019-04-11 ENCOUNTER — Telehealth: Payer: Self-pay | Admitting: Internal Medicine

## 2019-04-11 NOTE — Telephone Encounter (Signed)
I called pt and left vm to schedule Return for sch fasting labs asap, f/u in 3-6 months.

## 2019-04-12 ENCOUNTER — Other Ambulatory Visit: Payer: Self-pay

## 2019-04-12 ENCOUNTER — Telehealth: Payer: Self-pay | Admitting: Internal Medicine

## 2019-04-12 ENCOUNTER — Other Ambulatory Visit (INDEPENDENT_AMBULATORY_CARE_PROVIDER_SITE_OTHER): Payer: BC Managed Care – PPO

## 2019-04-12 DIAGNOSIS — Z1389 Encounter for screening for other disorder: Secondary | ICD-10-CM | POA: Diagnosis not present

## 2019-04-12 DIAGNOSIS — K9041 Non-celiac gluten sensitivity: Secondary | ICD-10-CM

## 2019-04-12 DIAGNOSIS — R682 Dry mouth, unspecified: Secondary | ICD-10-CM

## 2019-04-12 DIAGNOSIS — Z13220 Encounter for screening for lipoid disorders: Secondary | ICD-10-CM | POA: Diagnosis not present

## 2019-04-12 DIAGNOSIS — E559 Vitamin D deficiency, unspecified: Secondary | ICD-10-CM

## 2019-04-12 DIAGNOSIS — Z1329 Encounter for screening for other suspected endocrine disorder: Secondary | ICD-10-CM | POA: Diagnosis not present

## 2019-04-12 DIAGNOSIS — Z0000 Encounter for general adult medical examination without abnormal findings: Secondary | ICD-10-CM

## 2019-04-12 LAB — LIPID PANEL
Cholesterol: 139 mg/dL (ref 0–200)
HDL: 62 mg/dL (ref >39.00)
LDL Cholesterol: 63 mg/dL (ref 0–99)
NonHDL: 77.47
Total CHOL/HDL Ratio: 2
Triglycerides: 70 mg/dL (ref 0.0–149.0)
VLDL: 14 mg/dL (ref 0.0–40.0)

## 2019-04-12 LAB — COMPREHENSIVE METABOLIC PANEL
ALT: 15 U/L (ref 0–35)
AST: 16 U/L (ref 0–37)
Albumin: 4.4 g/dL (ref 3.5–5.2)
Alkaline Phosphatase: 58 U/L (ref 39–117)
BUN: 9 mg/dL (ref 6–23)
CO2: 29 mEq/L (ref 19–32)
Calcium: 9 mg/dL (ref 8.4–10.5)
Chloride: 103 mEq/L (ref 96–112)
Creatinine, Ser: 0.76 mg/dL (ref 0.40–1.20)
GFR: 85.91 mL/min (ref >60.00)
Glucose, Bld: 87 mg/dL (ref 70–99)
Potassium: 4.3 mEq/L (ref 3.5–5.1)
Sodium: 138 mEq/L (ref 135–145)
Total Bilirubin: 0.7 mg/dL (ref 0.2–1.2)
Total Protein: 6.7 g/dL (ref 6.0–8.3)

## 2019-04-12 LAB — CBC WITH DIFFERENTIAL/PLATELET
Basophils Absolute: 0 10*3/uL (ref 0.0–0.1)
Basophils Relative: 0.6 % (ref 0.0–3.0)
Eosinophils Absolute: 0.1 10*3/uL (ref 0.0–0.7)
Eosinophils Relative: 1.7 % (ref 0.0–5.0)
HCT: 42.8 % (ref 36.0–46.0)
Hemoglobin: 14.3 g/dL (ref 12.0–15.0)
Lymphocytes Relative: 25.8 % (ref 12.0–46.0)
Lymphs Abs: 1.2 10*3/uL (ref 0.7–4.0)
MCHC: 33.4 g/dL (ref 30.0–36.0)
MCV: 89 fl (ref 78.0–100.0)
Monocytes Absolute: 0.4 10*3/uL (ref 0.1–1.0)
Monocytes Relative: 9.1 % (ref 3.0–12.0)
Neutro Abs: 2.8 10*3/uL (ref 1.4–7.7)
Neutrophils Relative %: 62.8 % (ref 43.0–77.0)
Platelets: 244 10*3/uL (ref 150.0–400.0)
RBC: 4.81 Mil/uL (ref 3.87–5.11)
RDW: 12.4 % (ref 11.5–15.5)
WBC: 4.5 10*3/uL (ref 4.0–10.5)

## 2019-04-12 LAB — TSH: TSH: 0.74 u[IU]/mL (ref 0.35–4.50)

## 2019-04-12 LAB — T4, FREE: Free T4: 1.05 ng/dL (ref 0.60–1.60)

## 2019-04-12 LAB — VITAMIN D 25 HYDROXY (VIT D DEFICIENCY, FRACTURES): VITD: 53.87 ng/mL (ref 30.00–100.00)

## 2019-04-12 NOTE — Telephone Encounter (Signed)
I called pt and left vm for pt to call ofc to schedule a f/u in 3-6 months.

## 2019-04-13 LAB — URINALYSIS, ROUTINE W REFLEX MICROSCOPIC
Bilirubin Urine: NEGATIVE
Glucose, UA: NEGATIVE
Hgb urine dipstick: NEGATIVE
Ketones, ur: NEGATIVE
Leukocytes,Ua: NEGATIVE
Nitrite: NEGATIVE
Protein, ur: NEGATIVE
Specific Gravity, Urine: 1.021 (ref 1.001–1.03)
pH: <=5 (ref 5.0–8.0)

## 2019-04-14 ENCOUNTER — Telehealth: Payer: Self-pay | Admitting: Internal Medicine

## 2019-04-14 NOTE — Telephone Encounter (Signed)
Patient calling for lab results from 6/30. NT unavailable.

## 2019-04-16 LAB — GLIADIN ANTIBODIES, SERUM
Gliadin IgA: 11 Units
Gliadin IgG: 2 Units

## 2019-04-16 LAB — SJOGRENS SYNDROME-A EXTRACTABLE NUCLEAR ANTIBODY: SSA (Ro) (ENA) Antibody, IgG: <1 AI

## 2019-04-16 LAB — ANA: Anti Nuclear Antibody (ANA): NEGATIVE

## 2019-04-16 LAB — TISSUE TRANSGLUTAMINASE, IGA: (tTG) Ab, IgA: 1 U/mL

## 2019-04-16 LAB — SJOGRENS SYNDROME-B EXTRACTABLE NUCLEAR ANTIBODY: SSB (La) (ENA) Antibody, IgG: <1 AI

## 2019-04-16 LAB — RETICULIN ANTIBODIES, IGA W TITER: Reticulin IGA Screen: NEGATIVE

## 2019-04-18 ENCOUNTER — Encounter: Payer: Self-pay | Admitting: *Deleted

## 2019-10-04 ENCOUNTER — Other Ambulatory Visit: Payer: Self-pay

## 2019-10-04 ENCOUNTER — Ambulatory Visit (INDEPENDENT_AMBULATORY_CARE_PROVIDER_SITE_OTHER): Payer: BC Managed Care – PPO | Admitting: Certified Nurse Midwife

## 2019-10-04 ENCOUNTER — Encounter: Payer: Self-pay | Admitting: Certified Nurse Midwife

## 2019-10-04 VITALS — BP 90/70 | HR 68 | Temp 97.5°F | Ht 68.75 in | Wt 148.0 lb

## 2019-10-04 DIAGNOSIS — Z01419 Encounter for gynecological examination (general) (routine) without abnormal findings: Secondary | ICD-10-CM

## 2019-10-04 DIAGNOSIS — Z30431 Encounter for routine checking of intrauterine contraceptive device: Secondary | ICD-10-CM

## 2019-10-04 DIAGNOSIS — Z1239 Encounter for other screening for malignant neoplasm of breast: Secondary | ICD-10-CM

## 2019-10-04 NOTE — Progress Notes (Addendum)
Gynecology Annual Exam  PCP: McLean-Scocuzza, Nino Glow, MD  Chief Complaint:  Chief Complaint  Patient presents with  . Gynecologic Exam    has had spotting the last two months c IUD    History of Present Illness:Ruth Williams is a 38 year old Caucasian/White female, [place], who presents for her annual exam. She is having no significant GYN problems.  She stopped having menses after her Mirena IUD was replaced 01/06/18 until 2 months ago. In November and again in December she felt bloated and had some brown spotting x 4-5 days, accompanied by some cramping. LMP 11 December.  The patient's past medical history is remarkable for a history of a detached retina on the right and retinal tears on the left-treated successfully with laser. She also has a history of BCC and had Moh's surgery on the bridge of her nose.  Since her last annual exam 12/21/2017, she has had no other significant changes in her health.  Her father died 2 years ago at age 56 from bladder and colon cancer. Father had some type of testing done that places patient at higher risk of cancer? Hers2 positive?  Patient definitely needs earlier screening for colon cancer.  She is sexually active. She is currently using a Mirena IUD for contraception.  Her most recent pap smear was obtained 12/21/2017 and was NIL  Mammogram is not applicable.  There is no family history of breast cancer.  There is no family history of ovarian cancer.  The patient does do occasional self breast exams.  The patient does not smoke.  The patient does drink 4 glasses of wine a week..  The patient does not use illegal drugs.  The patient has been exercising at home on an elliptical trainer or treadmill since the gyms closed with Covid.  The patient does get adequate calcium in her diet.  She had a recent cholesterol screen in 2020 that was normal.      Review of Systems: Review of Systems  Constitutional: Negative for chills, fever and  weight loss.  HENT: Negative for congestion, sinus pain and sore throat.   Eyes: Negative for blurred vision and pain.  Respiratory: Negative for hemoptysis, shortness of breath and wheezing.   Cardiovascular: Negative for chest pain, palpitations and leg swelling.  Gastrointestinal: Negative for abdominal pain, blood in stool, diarrhea, heartburn, nausea and vomiting.  Genitourinary: Negative for dysuria, frequency, hematuria and urgency.  Musculoskeletal: Negative for back pain, joint pain and myalgias.  Skin: Negative for itching and rash.  Neurological: Negative for dizziness, tingling and headaches.  Endo/Heme/Allergies: Negative for environmental allergies and polydipsia. Does not bruise/bleed easily.       Negative for hirsutism   Psychiatric/Behavioral: Negative for depression. The patient is not nervous/anxious and does not have insomnia.     Past Medical History:  Past Medical History:  Diagnosis Date  . Allergy    seasonal  . Basal cell carcinoma    right nose s/p mohs in 2018 Dr. Laurence Ferrari is dermatologist  . Bleeding from the nose   . Chicken pox   . Complication of anesthesia    Reports that time she was put to sleep reports that she woke up "crazy"  . Detached retina 05/2016   right  . Migraines   . SAB (spontaneous abortion) 07/13/2009  . Shingles    x2    Past Surgical History:  Past Surgical History:  Procedure Laterality Date  . AIR/FLUID EXCHANGE Right  06/10/2016   Procedure: AIR/FLUID EXCHANGE;  Surgeon: Hayden Pedro, MD;  Location: [facility];  Service: Ophthalmology;  Laterality: Right;  . BREAST ENHANCEMENT SURGERY  07/13/2014  . MOHS SURGERY  08/2017   basal cell carcinoma removal from nose  . PHOTOCOAGULATION WITH LASER Bilateral 06/10/2016   Procedure: PHOTOCOAGULATION WITH LASER - HEADSCOPE LASER;  Surgeon: Hayden Pedro, MD;  Location: [facility];  Service: Ophthalmology;  Laterality: Bilateral;  . RETINAL DETACHMENT SURGERY     right eye  . SCLERAL  BUCKLE Right 06/10/2016   Procedure: SCLERAL BUCKLE;  Surgeon: Hayden Pedro, MD;  Location: [facility];  Service: Ophthalmology;  Laterality: Right;    Family History:  Family History  Problem Relation Age of Onset  . Mental illness Mother   . Hypertension Mother   . COPD Mother        smoker died age 48  . Colon cancer Father 66  . Bladder Cancer Father 29       bladder to colon her2+ died age 11   . Diabetes Maternal Grandmother   . Throat cancer Paternal Grandfather   . Pancreatic cancer Paternal Grandfather   . Stomach cancer Maternal Aunt 32  . Celiac disease Half-Sister     Social History:  Social History   Socioeconomic History  . Marital status: Married    Spouse name: Not on file  . Number of children: 2  . Years of education: Not on file  . Highest education level: Not on file  Occupational History  . Occupation: Homemaker  Tobacco Use  . Smoking status: Never Smoker  . Smokeless tobacco: Never Used  Substance and Sexual Activity  . Alcohol use: Yes    Comment: occasional  . Drug use: No  . Sexual activity: Yes    Partners: Female    Birth control/protection: I.U.D.  Other Topics Concern  . Not on file  Social History Narrative   1 daughter 75 y.o, 1 son 93 y.o as of 04/08/19   Husband Zaire Vanbuskirk [phone] DPR   Social Determinants of Health   Financial Resource Strain:   . Difficulty of Paying Living Expenses: Not on file  Food Insecurity:   . Worried About Charity fundraiser in the Last Year: Not on file  . Ran Out of Food in the Last Year: Not on file  Transportation Needs:   . Lack of Transportation (Medical): Not on file  . Lack of Transportation (Non-Medical): Not on file  Physical Activity:   . Days of Exercise per Week: Not on file  . Minutes of Exercise per Session: Not on file  Stress:   . Feeling of Stress : Not on file  Social Connections:   . Frequency of Communication with Friends and Family: Not on file  . Frequency of  Social Gatherings with Friends and Family: Not on file  . Attends Religious Services: Not on file  . Active Member of Clubs or Organizations: Not on file  . Attends Archivist Meetings: Not on file  . Marital Status: Not on file  Intimate Partner Violence:   . Fear of Current or Ex-Partner: Not on file  . Emotionally Abused: Not on file  . Physically Abused: Not on file  . Sexually Abused: Not on file    Allergies:  Allergies  Allergen Reactions  . No Known Allergies     Medications:  Current Outpatient Medications on File Prior to Visit  Medication Sig Dispense Refill  .  ibuprofen (ADVIL,MOTRIN) 200 MG tablet Take 200 mg by mouth daily as needed for fever (pain).    Marland Kitchen levonorgestrel (MIRENA) 20 MCG/24HR IUD 1 each by Intrauterine route once. Implanted March 2014     No current facility-administered medications on file prior to visit.  Physical Exam Vitals: BP 90/70   Pulse 68   Temp (!) 97.5 F (36.4 C)   Ht 5' 8.75" (1.746 m)   Wt 148 lb (67.1 kg)   LMP 09/23/2019 (LMP Unknown) Comment: spotting  BMI 22.02 kg/m   General: WF in NAD HEENT: normocephalic, anicteric Neck: no thyroid enlargement, no palpable nodules, no cervical lymphadenopathy  Pulmonary: No increased work of breathing, CTAB Cardiovascular: RRR, without murmur  Breast: Breast implants present, no tenderness, no palpable nodules or masses, no skin or nipple retraction present, no nipple discharge.  No axillary, infraclavicular or supraclavicular lymphadenopathy. Abdomen: Soft, non-tender, non-distended.  Umbilicus without lesions.  No hepatomegaly or masses palpable. No evidence of hernia. Genitourinary:  External: Normal external female genitalia.  Normal urethral meatus, normal Bartholin's and Skene's glands.    Vagina: Normal vaginal mucosa, no evidence of prolapse, moderate white discharge  Cervix: Grossly normal in appearance, no bleeding, non-tender, IUD strings palpable at cervical os at 7  o'clock.  Uterus: Anteflexed, normal size, shape, and consistency, mobile, and non-tender  Adnexa: No adnexal masses, non-tender  Rectal: deferred  Lymphatic: no evidence of inguinal lymphadenopathy Extremities: no edema, erythema, or tenderness Neurologic: Grossly intact Psychiatric: mood appropriate, affect full     Assessment: 38 y.o. normal annual gyn exam Bleeding on Mirena IUD not uncommon or inusual. IUD appears to be intact.  Plan:    1) Breast cancer screening - recommend monthly self breast exam and starting annual screening mammograms at age 36   2) Cervical cancer screening - Pap was not done. Desires Pap smears every 3 years. Next due in 2022  3) Contraception -Mirena IUD expires 12/2023  4) Routine healthcare maintenance including cholesterol and diabetes screening UTD   5) Will discuss her father's history with a genetic counselor and refer for counseling as needed.  Dalia Heading, CNM

## 2019-11-04 ENCOUNTER — Encounter: Payer: Self-pay | Admitting: Obstetrics and Gynecology

## 2019-11-08 ENCOUNTER — Ambulatory Visit: Payer: BC Managed Care – PPO

## 2019-11-08 ENCOUNTER — Other Ambulatory Visit: Payer: Self-pay

## 2019-11-08 DIAGNOSIS — Z800 Family history of malignant neoplasm of digestive organs: Secondary | ICD-10-CM | POA: Diagnosis not present

## 2019-11-08 DIAGNOSIS — Z808 Family history of malignant neoplasm of other organs or systems: Secondary | ICD-10-CM | POA: Diagnosis not present

## 2019-11-08 DIAGNOSIS — Z8052 Family history of malignant neoplasm of bladder: Secondary | ICD-10-CM | POA: Diagnosis not present

## 2019-11-14 DIAGNOSIS — Z1371 Encounter for nonprocreative screening for genetic disease carrier status: Secondary | ICD-10-CM

## 2019-11-14 HISTORY — DX: Encounter for nonprocreative screening for genetic disease carrier status: Z13.71

## 2019-11-17 ENCOUNTER — Encounter: Payer: Self-pay | Admitting: Obstetrics and Gynecology

## 2020-05-08 ENCOUNTER — Other Ambulatory Visit: Payer: Self-pay

## 2020-05-08 ENCOUNTER — Encounter: Payer: Self-pay | Admitting: Plastic Surgery

## 2020-05-08 ENCOUNTER — Ambulatory Visit (INDEPENDENT_AMBULATORY_CARE_PROVIDER_SITE_OTHER): Payer: Self-pay | Admitting: Plastic Surgery

## 2020-05-08 VITALS — BP 117/77 | HR 77 | Temp 98.1°F | Ht 69.0 in | Wt 144.0 lb

## 2020-05-08 DIAGNOSIS — Z719 Counseling, unspecified: Secondary | ICD-10-CM

## 2020-05-16 ENCOUNTER — Encounter: Payer: Self-pay | Admitting: Plastic Surgery

## 2020-05-16 NOTE — Progress Notes (Signed)
Patient ID: Ruth Williams, female    DOB: 1981-04-20, 39 y.o.   MRN: [id number]   Chief Complaint  Patient presents with  . Consult    The patient is a 39 year old female here for evaluation of her face.  The patient has not liked her chin for some time.  Now that she is gotten older and had kids she notices a lack of definition of her jawline.  She has a little bit of submental fat.  A very small amount of lack of projection of her chin is noted.  When I look at the lines from her gallbladder glabellar nose and chin its nearly harmonious.  I think the lack of definition of her jaw is certainly part of what she notices most.  She has very good skin.  Very little gelling and wrinkling.  Minimal nasolabial folds are noted.   Review of Systems  Constitutional: Negative.   HENT: Negative.   Eyes: Negative.   Respiratory: Negative.   Cardiovascular: Negative.   Gastrointestinal: Negative.   Endocrine: Negative.   Genitourinary: Negative.   Musculoskeletal: Negative.   Neurological: Negative.   Hematological: Negative.   Psychiatric/Behavioral: Negative.     Past Medical History:  Diagnosis Date  . Allergy    seasonal  . Basal cell carcinoma    right nose s/p mohs in 2018 Dr. Laurence Ferrari is dermatologist  . Bleeding from the nose   . BRCA negative 11/2019   MyRisk neg; IBIS=13%; riskscore=16.6%  . Chicken pox   . Complication of anesthesia    Reports that time she was put to sleep reports that she woke up "crazy"  . Detached retina 05/2016   right  . Encounter for colonoscopy in patient with family history of colon cancer    1/21 cancer genetic testing letter sent  . Family history of pancreatic cancer    cancer genetic testing neg  . Migraines   . SAB (spontaneous abortion) 07/13/2009  . Shingles    x2    Past Surgical History:  Procedure Laterality Date  . AIR/FLUID EXCHANGE Right 06/10/2016   Procedure: AIR/FLUID EXCHANGE;  Surgeon: Hayden Pedro, MD;  Location:  [facility];  Service: Ophthalmology;  Laterality: Right;  . BREAST ENHANCEMENT SURGERY  07/13/2014  . MOHS SURGERY  08/2017   basal cell carcinoma removal from nose  . PHOTOCOAGULATION WITH LASER Bilateral 06/10/2016   Procedure: PHOTOCOAGULATION WITH LASER - HEADSCOPE LASER;  Surgeon: Hayden Pedro, MD;  Location: [facility];  Service: Ophthalmology;  Laterality: Bilateral;  . RETINAL DETACHMENT SURGERY     right eye  . SCLERAL BUCKLE Right 06/10/2016   Procedure: SCLERAL BUCKLE;  Surgeon: Hayden Pedro, MD;  Location: [facility];  Service: Ophthalmology;  Laterality: Right;      Current Outpatient Medications:  .  Ascorbic Acid (VITAMIN C PO), Take by mouth., Disp: , Rfl:  .  ibuprofen (ADVIL,MOTRIN) 200 MG tablet, Take 200 mg by mouth daily as needed for fever (pain)., Disp: , Rfl:  .  levonorgestrel (MIRENA) 20 MCG/24HR IUD, 1 each by Intrauterine route once. Implanted March 2014, Disp: , Rfl:  .  VITAMIN D PO, Take by mouth., Disp: , Rfl:    Objective:   Vitals:   05/08/20 0925  BP: 117/77  Pulse: 77  Temp: 98.1 F (36.7 C)  SpO2: 100%    Physical Exam Vitals and nursing note reviewed.  Constitutional:      Appearance: Normal appearance.  HENT:  Head: Normocephalic and atraumatic.  Cardiovascular:     Rate and Rhythm: Normal rate.     Pulses: Normal pulses.  Pulmonary:     Effort: Pulmonary effort is normal.  Abdominal:     General: Abdomen is flat.  Neurological:     General: No focal deficit present.     Mental Status: She is alert and oriented to person, place, and time.  Psychiatric:        Mood and Affect: Mood normal.        Thought Content: Thought content normal.     Assessment & Plan:  Encounter for counseling  We discussed options for enhancing the jaw.  Surgical intervention with osteotomies is the most significant and would provide the best most noticeable result in improvement.  This seems a bit dramatic for nice face in the otherwise nice profile.  The  patient agrees.  We talked about Kybella or cool sculpting for the submental area.  We also talked about liposuction of the submental area.  Earlie Raveling is another option.  Botox for the upper face is beneficial as well for the glabella area and forehead lines.  The patient is going to think things over and let us know which she would like to do. Pictures were obtained of the patient and placed in the chart with the patient's or guardian's permission.   [place], DO

## 2020-07-10 ENCOUNTER — Telehealth: Payer: Self-pay | Admitting: Plastic Surgery

## 2020-07-10 NOTE — Telephone Encounter (Signed)
Message received from Ruth Williams regarding patient's upcoming surgery: "Patient changed her mind and isn't ready for the surgery. She does not wish to reschedule at this time."  All corresponding appointments have been canceled, including the surgery through [facility] - for [facility] booking.

## 2020-07-24 ENCOUNTER — Encounter: Payer: BC Managed Care – PPO | Admitting: Surgical

## 2020-08-24 ENCOUNTER — Encounter: Payer: BC Managed Care – PPO | Admitting: Plastic Surgery

## 2020-08-31 ENCOUNTER — Encounter: Payer: BC Managed Care – PPO | Admitting: Surgical

## 2020-10-26 ENCOUNTER — Encounter: Payer: Self-pay | Admitting: Oncology

## 2020-10-26 ENCOUNTER — Other Ambulatory Visit: Payer: Self-pay | Admitting: Oncology

## 2020-10-26 ENCOUNTER — Ambulatory Visit (HOSPITAL_COMMUNITY)
Admission: RE | Admit: 2020-10-26 | Discharge: 2020-10-26 | Disposition: A | Discharge status: Home / Self Care | Payer: BC Managed Care – PPO | Source: Ambulatory Visit | Attending: Pulmonary Disease | Admitting: Pulmonary Disease

## 2020-10-26 DIAGNOSIS — U071 COVID-19: Secondary | ICD-10-CM | POA: Insufficient documentation

## 2020-10-26 DIAGNOSIS — Z283 Underimmunization status: Secondary | ICD-10-CM | POA: Insufficient documentation

## 2020-10-26 DIAGNOSIS — Z6825 Body mass index (BMI) 25.0-25.9, adult: Secondary | ICD-10-CM | POA: Diagnosis not present

## 2020-10-26 MED ORDER — METHYLPREDNISOLONE SODIUM SUCC 125 MG IJ SOLR: 125.0000 mg | Freq: Once | INTRAMUSCULAR | Status: DC | PRN

## 2020-10-26 MED ORDER — EPINEPHRINE 0.3 MG/0.3ML IJ SOAJ: 0.3000 mg | Freq: Once | INTRAMUSCULAR | Status: DC | PRN

## 2020-10-26 MED ORDER — FAMOTIDINE IN NACL 20-0.9 MG/50ML-% IV SOLN: 20.0000 mg | Freq: Once | INTRAVENOUS | Status: DC | PRN

## 2020-10-26 MED ORDER — DIPHENHYDRAMINE HCL 50 MG/ML IJ SOLN: 50.0000 mg | Freq: Once | INTRAMUSCULAR | Status: DC | PRN

## 2020-10-26 MED ORDER — SOTROVIMAB 500 MG/8ML IV SOLN
500.0000 mg | Freq: Once | INTRAVENOUS | Status: AC
  Administered 2020-10-26: 500 mg via INTRAVENOUS

## 2020-10-26 MED ORDER — ALBUTEROL SULFATE HFA 108 (90 BASE) MCG/ACT IN AERS: 2.0000 | INHALATION_SPRAY | Freq: Once | RESPIRATORY_TRACT | Status: DC | PRN

## 2020-10-26 MED ORDER — SODIUM CHLORIDE 0.9 % IV SOLN: INTRAVENOUS | Status: DC | PRN

## 2020-10-26 NOTE — Progress Notes (Signed)
Patient reviewed Fact Sheet for Patients, Parents, and Caregivers for Emergency Use Authorization (EUA) of Sotrovimab for the Treatment of Coronavirus. Patient also reviewed and is agreeable to the estimated cost of treatment. Patient is agreeable to proceed.   

## 2020-10-26 NOTE — Progress Notes (Signed)
I connected by phone with Ruth Williams on 10/26/2020 at 1:00 PM to discuss the potential use of a new treatment for mild to moderate COVID-19 viral infection in non-hospitalized patients.  This patient is a 40 y.o. female that meets the FDA criteria for Emergency Use Authorization of COVID monoclonal antibody sotrovimab.  Has a (+) direct SARS-CoV-2 viral test result  Has mild or moderate COVID-19   Is NOT hospitalized due to COVID-19  Is within 10 days of symptom onset  Has at least one of the high risk factor(s) for progression to severe COVID-19 and/or hospitalization as defined in EUA.  Specific high risk criteria : BMI > 25 and Other high risk medical condition per CDC:  unvaccinated   I have spoken and communicated the following to the patient or parent/caregiver regarding COVID monoclonal antibody treatment:  1. FDA has authorized the emergency use for the treatment of mild to moderate COVID-19 in adults and pediatric patients with positive results of direct SARS-CoV-2 viral testing who are 83 years of age and older weighing at least 40 kg, and who are at high risk for progressing to severe COVID-19 and/or hospitalization.  2. The significant known and potential risks and benefits of COVID monoclonal antibody, and the extent to which such potential risks and benefits are unknown.  3. Information on available alternative treatments and the risks and benefits of those alternatives, including clinical trials.  4. Patients treated with COVID monoclonal antibody should continue to self-isolate and use infection control measures (e.g., wear mask, isolate, social distance, avoid sharing personal items, clean and disinfect "high touch" surfaces, and frequent handwashing) according to CDC guidelines.   5. The patient or parent/caregiver has the option to accept or refuse COVID monoclonal antibody treatment.  After reviewing this information with the patient, the patient has agreed  to receive one of the available covid 19 monoclonal antibodies and will be provided an appropriate fact sheet prior to infusion. Jacquelin Hawking, NP 10/26/2020 1:00 PM

## 2020-10-26 NOTE — Progress Notes (Signed)
Diagnosis: COVID-19  Physician: Dr. Patrick Wright  Procedure: [facility]fusion [facility] Med: Sotrovimab infusion - Provided patient with sotrovimab fact sheet for patients, parents, and caregivers prior to infusion.   Complications: No immediate complications noted  Discharge: Discharged home    

## 2020-10-26 NOTE — Discharge Instructions (Signed)

## 2020-10-26 NOTE — Addendum Note (Signed)
Addended by: Loel Dubonnet on: 10/26/2020 02:47 PM   Modules accepted: Orders, SmartSet

## 2020-11-19 ENCOUNTER — Ambulatory Visit (INDEPENDENT_AMBULATORY_CARE_PROVIDER_SITE_OTHER): Payer: BC Managed Care – PPO

## 2020-11-19 ENCOUNTER — Other Ambulatory Visit: Payer: Self-pay

## 2020-11-19 DIAGNOSIS — L719 Rosacea, unspecified: Secondary | ICD-10-CM

## 2020-11-19 DIAGNOSIS — I781 Nevus, non-neoplastic: Secondary | ICD-10-CM

## 2020-11-19 NOTE — Progress Notes (Signed)
BBL annual maintenance. Pt tolerated well. Recommended scheduling her annual skin check.

## 2021-02-28 ENCOUNTER — Ambulatory Visit: Payer: BC Managed Care – PPO | Admitting: Dermatology

## 2021-04-08 DIAGNOSIS — B078 Other viral warts: Secondary | ICD-10-CM | POA: Diagnosis not present

## 2021-04-08 DIAGNOSIS — L821 Other seborrheic keratosis: Secondary | ICD-10-CM | POA: Diagnosis not present

## 2021-04-08 DIAGNOSIS — D225 Melanocytic nevi of trunk: Secondary | ICD-10-CM | POA: Diagnosis not present

## 2021-04-08 DIAGNOSIS — L816 Other disorders of diminished melanin formation: Secondary | ICD-10-CM | POA: Diagnosis not present

## 2021-04-08 DIAGNOSIS — L84 Corns and callosities: Secondary | ICD-10-CM | POA: Diagnosis not present

## 2021-07-01 ENCOUNTER — Ambulatory Visit: Payer: BC Managed Care – PPO | Admitting: Dermatology

## 2021-11-04 ENCOUNTER — Other Ambulatory Visit (HOSPITAL_COMMUNITY)
Admission: RE | Admit: 2021-11-04 | Discharge: 2021-11-04 | Disposition: A | Discharge status: Home / Self Care | Payer: BC Managed Care – PPO | Source: Ambulatory Visit | Attending: Licensed Practical Nurse | Admitting: Licensed Practical Nurse

## 2021-11-04 ENCOUNTER — Ambulatory Visit (INDEPENDENT_AMBULATORY_CARE_PROVIDER_SITE_OTHER): Payer: BC Managed Care – PPO | Admitting: Licensed Practical Nurse

## 2021-11-04 ENCOUNTER — Other Ambulatory Visit: Payer: Self-pay

## 2021-11-04 VITALS — BP 122/74 | Ht 69.0 in | Wt 142.0 lb

## 2021-11-04 DIAGNOSIS — Z13220 Encounter for screening for lipoid disorders: Secondary | ICD-10-CM | POA: Diagnosis not present

## 2021-11-04 DIAGNOSIS — Z01419 Encounter for gynecological examination (general) (routine) without abnormal findings: Secondary | ICD-10-CM | POA: Diagnosis not present

## 2021-11-04 DIAGNOSIS — Z113 Encounter for screening for infections with a predominantly sexual mode of transmission: Secondary | ICD-10-CM

## 2021-11-04 DIAGNOSIS — T148XXA Other injury of unspecified body region, initial encounter: Secondary | ICD-10-CM

## 2021-11-04 DIAGNOSIS — Z124 Encounter for screening for malignant neoplasm of cervix: Secondary | ICD-10-CM | POA: Diagnosis not present

## 2021-11-04 NOTE — Progress Notes (Signed)
Gynecology Annual Exam   PCP: Ruth Williams, No Pcp Per (Inactive) [place]   Chief Complaint:  Chief Complaint  Ruth Williams presents with   Annual Exam    History of Present Illness: Ruth Williams is a 41 y.o. [id number] presents for annual exam. The Ruth Williams has no complaints today.   LMP: 09/22/2021 Average Interval: regular, 28 days  Heavy Menses: no Clots: no Intermenstrual Bleeding: no Postcoital Bleeding: no Dysmenorrhea: no  The Ruth Williams is sexually active. She currently uses IUD for contraception. She denies dyspareunia.  The Ruth Williams does not perform self breast exams.  There is no notable family history of breast or ovarian cancer in her family.  The Ruth Williams wears seatbelts: yes.   The Ruth Williams has regular exercise: yes.   She goes to the gym and walks at least 3 times a week  Eats a well balanced diet  Calcium through dairy, takes Vit D supplement  Lives with husband and children, feel safe at home  The Ruth Williams denies current symptoms of depression.   Admits to occasional anxiety  Review of Systems: Review of Systems  Constitutional: Negative.   HENT: Negative.    Eyes: Negative.   Respiratory: Negative.    Cardiovascular: Negative.   Gastrointestinal: Negative.   Genitourinary: Negative.   Musculoskeletal: Negative.   Skin: Negative.   Neurological: Negative.   Endo/Heme/Allergies:  Bruises/bleeds easily.  Psychiatric/Behavioral: Negative.     Past Medical History:  Ruth Williams Active Problem List   Diagnosis Date Noted   Basal cell carcinoma     right nose s/p mohs in 2018 Dr. Laurence Ferrari is dermatologist    Family history of colon cancer in father 12/28/2017    Refer for colonoscopy at age 25.    Rhegmatogenous retinal detachment of right eye 06/10/2016   Retinal break of left eye 06/10/2016   Right hip pain 05/21/2016   Visual disturbance of one eye 05/21/2016   Encounter for counseling 05/21/2016    Past Surgical History:  Past Surgical History:  Procedure  Laterality Date   AIR/FLUID EXCHANGE Right 06/10/2016   Procedure: AIR/FLUID EXCHANGE;  Surgeon: Hayden Pedro, MD;  Location: [facility];  Service: Ophthalmology;  Laterality: Right;   BREAST ENHANCEMENT SURGERY  07/13/2014   MOHS SURGERY  08/2017   basal cell carcinoma removal from nose   PHOTOCOAGULATION WITH LASER Bilateral 06/10/2016   Procedure: PHOTOCOAGULATION WITH LASER - HEADSCOPE LASER;  Surgeon: Hayden Pedro, MD;  Location: [facility];  Service: Ophthalmology;  Laterality: Bilateral;   RETINAL DETACHMENT SURGERY     right eye   SCLERAL BUCKLE Right 06/10/2016   Procedure: SCLERAL BUCKLE;  Surgeon: Hayden Pedro, MD;  Location: [facility];  Service: Ophthalmology;  Laterality: Right;    Gynecologic History:  No LMP recorded. (Menstrual status: IUD). Contraception: IUD Last Pap: Results were: no abnormalities   Obstetric History: [id number]  Family History:  Family History  Problem Relation Age of Onset   Mental illness Mother    Hypertension Mother    COPD Mother        smoker died age 86   Colon cancer Father 73   Bladder Cancer Father 65       bladder to colon her2+ died age 46    Diabetes Maternal Grandmother    Throat cancer Paternal Grandfather    Pancreatic cancer Paternal Grandfather    Stomach cancer Maternal Aunt 30   Celiac disease Half-Sister     Social History:  Social History   Socioeconomic History  Marital status: Married    Spouse name: Not on file   Number of children: 2   Years of education: Not on file   Highest education level: Not on file  Occupational History   Occupation: Homemaker  Tobacco Use   Smoking status: Former    Types: Cigarettes    Quit date: 05/09/1999    Years since quitting: 22.5   Smokeless tobacco: Never  Vaping Use   Vaping Use: Never used  Substance and Sexual Activity   Alcohol use: Yes    Comment: occasional   Drug use: No   Sexual activity: Yes    Partners: Female    Birth control/protection: I.U.D.  Other Topics  Concern   Not on file  Social History Narrative   1 daughter 65 y.o, 1 son 35 y.o as of 04/08/19   Husband Quintella Mura [phone] DPR   Social Determinants of Health   Financial Resource Strain: Not on file  Food Insecurity: Not on file  Transportation Needs: Not on file  Physical Activity: Not on file  Stress: Not on file  Social Connections: Not on file  Intimate Partner Violence: Not on file    Allergies:  Allergies  Allergen Reactions   No Known Allergies     Medications: Prior to Admission medications   Medication Sig Start Date End Date Taking? Authorizing Provider  Ascorbic Acid (VITAMIN C PO) Take by mouth.   Yes [provider]  VITAMIN D PO Take by mouth.   Yes [provider]  ibuprofen (ADVIL,MOTRIN) 200 MG tablet Take 200 mg by mouth daily as needed for fever (pain).    [provider]  levonorgestrel (MIRENA) 20 MCG/24HR IUD 1 each by Intrauterine route once. Implanted March 2014 Ruth Williams not taking: Reported on 11/04/2021    [provider]    Physical Exam Vitals: Blood pressure 122/74, height _0  (1.753 m), weight 142 lb (64.4 kg).  General: NAD HEENT: normocephalic, anicteric Thyroid: no enlargement, no palpable nodules Pulmonary: No increased work of breathing, CTAB Cardiovascular: RRR, distal pulses 2+ Breast: Breast symmetrical, no tenderness, no palpable nodules or masses, no skin or nipple retraction present, no nipple discharge.  No axillary or supraclavicular lymphadenopathy. Abdomen: NABS, soft, non-tender, non-distended.  Umbilicus without lesions.  No hepatomegaly, splenomegaly or masses palpable. No evidence of hernia  Genitourinary:  External: Normal external female genitalia.  Normal urethral meatus, normal Bartholin's and Skene's glands.    Vagina: Normal vaginal mucosa, no evidence of prolapse.    Cervix: Grossly normal in appearance, no bleeding, IUD string just barely palpable.  Uterus:  Non-enlarged, mobile, normal contour.  No CMT  Adnexa: ovaries non-enlarged, no adnexal masses  Rectal: deferred  Lymphatic: no evidence of inguinal lymphadenopathy Extremities: no edema, erythema, or tenderness Neurologic: Grossly intact Psychiatric: mood appropriate, affect full   Healing ecchymotic area on both inner thighs, Natalia Leatherwood states she does not know how they got there. States "I bruise easily", does not use Ibuprofen or other blood thinners.     Assessment: 41 y.o. [id number] routine annual exam Cervical caner screening Cholesterol screening   Plan: Problem List Items Addressed This Visit   None Visit Diagnoses     Well woman exam    -  Primary   Relevant Orders   Cytology - PAP   Lipid panel   CBC w/Diff/Platelet   Cervical cancer screening       Relevant Orders   Cytology - PAP   Screening examination for  venereal disease       Relevant Orders   Cytology - PAP   Screening for cholesterol level       Relevant Orders   Lipid panel   Bruising       Relevant Orders   CBC w/Diff/Platelet       2) STI screening  wasoffered and  declines blood work  2)  ASCCP guidelines and rational discussed.  Ruth Williams opts for every 5 years screening interval  3) Contraception - the Ruth Williams is currently using  IUD.  She is happy with her current form of contraception and plans to continue  4) Routine healthcare maintenance including cholesterol, diabetes screening discussed  cholesterol screening collected today   5) No follow-ups on file.  Roberto Scales, [facility], [facility] 11/04/2021, 10:47 AM

## 2021-11-05 LAB — CBC WITH DIFFERENTIAL/PLATELET
Basophils Absolute: 0 10*3/uL (ref 0.0–0.2)
Basos: 1 %
EOS (ABSOLUTE): 0.1 10*3/uL (ref 0.0–0.4)
Eos: 2 %
Hematocrit: 44.3 % (ref 34.0–46.6)
Hemoglobin: 14.3 g/dL (ref 11.1–15.9)
Immature Grans (Abs): 0 10*3/uL (ref 0.0–0.1)
Immature Granulocytes: 1 %
Lymphocytes Absolute: 1.2 10*3/uL (ref 0.7–3.1)
Lymphs: 28 %
MCH: 28.9 pg (ref 26.6–33.0)
MCHC: 32.3 g/dL (ref 31.5–35.7)
MCV: 90 fL (ref 79–97)
Monocytes Absolute: 0.4 10*3/uL (ref 0.1–0.9)
Monocytes: 9 %
Neutrophils Absolute: 2.5 10*3/uL (ref 1.4–7.0)
Neutrophils: 59 %
Platelets: 262 10*3/uL (ref 150–450)
RBC: 4.95 x10E6/uL (ref 3.77–5.28)
RDW: 12.8 % (ref 11.7–15.4)
WBC: 4.1 10*3/uL (ref 3.4–10.8)

## 2021-11-05 LAB — LIPID PANEL
Chol/HDL Ratio: 2.2 ratio (ref 0.0–4.4)
Cholesterol, Total: 158 mg/dL (ref 100–199)
HDL: 72 mg/dL (ref >39)
LDL Chol Calc (NIH): 75 mg/dL (ref 0–99)
Triglycerides: 51 mg/dL (ref 0–149)
VLDL Cholesterol Cal: 11 mg/dL (ref 5–40)

## 2021-11-05 LAB — CYTOLOGY - PAP
Chlamydia: NEGATIVE
Comment: NEGATIVE
Comment: NEGATIVE
Comment: NEGATIVE
Comment: NORMAL
Diagnosis: NEGATIVE
High risk HPV: NEGATIVE
Neisseria Gonorrhea: NEGATIVE
Trichomonas: NEGATIVE

## 2021-11-06 ENCOUNTER — Encounter: Payer: Self-pay | Admitting: Licensed Practical Nurse

## 2021-11-19 DIAGNOSIS — H66002 Acute suppurative otitis media without spontaneous rupture of ear drum, left ear: Secondary | ICD-10-CM | POA: Diagnosis not present

## 2022-01-07 DIAGNOSIS — D2271 Melanocytic nevi of right lower limb, including hip: Secondary | ICD-10-CM | POA: Diagnosis not present

## 2022-01-07 DIAGNOSIS — Z08 Encounter for follow-up examination after completed treatment for malignant neoplasm: Secondary | ICD-10-CM | POA: Diagnosis not present

## 2022-01-07 DIAGNOSIS — D225 Melanocytic nevi of trunk: Secondary | ICD-10-CM | POA: Diagnosis not present

## 2022-01-07 DIAGNOSIS — B070 Plantar wart: Secondary | ICD-10-CM | POA: Diagnosis not present

## 2022-01-07 DIAGNOSIS — B078 Other viral warts: Secondary | ICD-10-CM | POA: Diagnosis not present

## 2022-01-07 DIAGNOSIS — L814 Other melanin hyperpigmentation: Secondary | ICD-10-CM | POA: Diagnosis not present

## 2022-01-07 DIAGNOSIS — L820 Inflamed seborrheic keratosis: Secondary | ICD-10-CM | POA: Diagnosis not present

## 2022-01-07 DIAGNOSIS — R238 Other skin changes: Secondary | ICD-10-CM | POA: Diagnosis not present

## 2022-02-13 NOTE — Progress Notes (Deleted)
? ?I,Jana Apoorva Bugay,acting as a Education administrator for [employer], PA-C.,have documented all relevant documentation on the behalf of Mardene Speak, PA-C,as directed by  [employer], PA-C while in the presence of [employer], PA-C. ? ?New Patient Office Visit ? ?Subjective   ? ?Patient ID: Ruth Williams, female    DOB: 02/28/1981  Age: 41 y.o. MRN: [id number] ? ?CC: No chief complaint on file. ? ? ?HPI ?Ruth Williams presents to establish care ?*** ? ?Outpatient Encounter Medications as of 02/14/2022  ?Medication Sig  ? Ascorbic Acid (VITAMIN C PO) Take by mouth.  ? ibuprofen (ADVIL,MOTRIN) 200 MG tablet Take 200 mg by mouth daily as needed for fever (pain).  ? levonorgestrel (MIRENA) 20 MCG/24HR IUD 1 each by Intrauterine route once. Implanted March 2014 (Patient not taking: Reported on 11/04/2021)  ? VITAMIN D PO Take by mouth.  ? ?No facility-administered encounter medications on file as of 02/14/2022.  ? ? ?Past Medical History:  ?Diagnosis Date  ? Allergy   ? seasonal  ? Basal cell carcinoma 07/30/2017  ? R nasal dorsum - MOHS  ? Bleeding from the nose   ? BRCA negative 11/2019  ? MyRisk neg; IBIS=13%; riskscore=16.6%  ? Chicken pox   ? Complication of anesthesia   ? Reports that time she was put to sleep reports that she woke up "crazy"  ? Detached retina 05/2016  ? right  ? Encounter for colonoscopy in patient with family history of colon cancer   ? 1/21 cancer genetic testing letter sent  ? Family history of pancreatic cancer   ? cancer genetic testing neg  ? Migraines   ? SAB (spontaneous abortion) 07/13/2009  ? Shingles   ? x2  ? ? ?Past Surgical History:  ?Procedure Laterality Date  ? AIR/FLUID EXCHANGE Right 06/10/2016  ? Procedure: AIR/FLUID EXCHANGE;  Surgeon: Hayden Pedro, MD;  Location: [place];  Service: Ophthalmology;  Laterality: Right;  ? BREAST ENHANCEMENT SURGERY  07/13/2014  ? MOHS SURGERY  08/2017  ? basal cell carcinoma removal from nose  ? PHOTOCOAGULATION WITH LASER Bilateral 06/10/2016  ?  Procedure: PHOTOCOAGULATION WITH LASER - HEADSCOPE LASER;  Surgeon: Hayden Pedro, MD;  Location: [place];  Service: Ophthalmology;  Laterality: Bilateral;  ? RETINAL DETACHMENT SURGERY    ? right eye  ? SCLERAL BUCKLE Right 06/10/2016  ? Procedure: SCLERAL BUCKLE;  Surgeon: Hayden Pedro, MD;  Location: [place];  Service: Ophthalmology;  Laterality: Right;  ? ? ?Family History  ?Problem Relation Age of Onset  ? Mental illness Mother   ? Hypertension Mother   ? COPD Mother   ?     smoker died age 61  ? Colon cancer Father 22  ? Bladder Cancer Father 44  ?     bladder to colon her2+ died age 15   ? Diabetes Maternal Grandmother   ? Throat cancer Paternal Grandfather   ? Pancreatic cancer Paternal Grandfather   ? Stomach cancer Maternal Aunt 11  ? Celiac disease Half-Sister   ? ? ?Social History  ? ?Socioeconomic History  ? Marital status: Married  ?  Spouse name: Not on file  ? Number of children: 2  ? Years of education: Not on file  ? Highest education level: Not on file  ?Occupational History  ? Occupation: Homemaker  ?Tobacco Use  ? Smoking status: Former  ?  Types: Cigarettes  ?  Quit date: 05/09/1999  ?  Years since quitting: 22.7  ? Smokeless tobacco:  Never  ?Vaping Use  ? Vaping Use: Never used  ?Substance and Sexual Activity  ? Alcohol use: Yes  ?  Comment: occasional  ? Drug use: No  ? Sexual activity: Yes  ?  Partners: Female  ?  Birth control/protection: I.U.D.  ?Other Topics Concern  ? Not on file  ?Social History Narrative  ? 1 daughter 20 y.o, 1 son 43 y.o as of 04/08/19  ? Husband Karene Bracken [phone] DPR  ? ?Social Determinants of Health  ? ?Financial Resource Strain: Not on file  ?Food Insecurity: Not on file  ?Transportation Needs: Not on file  ?Physical Activity: Not on file  ?Stress: Not on file  ?Social Connections: Not on file  ?Intimate Partner Violence: Not on file  ? ? ?ROS ? ?  ? ? ?Objective   ? ?There were no vitals taken for this visit. ? ?Physical Exam ? ?{Labs (Optional):23779} ?   ? ?Assessment & Plan:  ? ?Problem List Items Addressed This Visit   ?None ? ? ?No follow-ups on file.  ? ?Lenetta Quaker, CMA ? ? ?

## 2022-02-14 ENCOUNTER — Encounter: Payer: Self-pay | Admitting: Physician Assistant

## 2022-02-14 ENCOUNTER — Other Ambulatory Visit: Payer: Self-pay

## 2022-02-14 ENCOUNTER — Ambulatory Visit (INDEPENDENT_AMBULATORY_CARE_PROVIDER_SITE_OTHER): Payer: BC Managed Care – PPO | Admitting: Physician Assistant

## 2022-02-14 VITALS — BP 108/67 | HR 69 | Temp 97.9°F | Resp 16 | Ht 69.0 in | Wt 143.6 lb

## 2022-02-14 DIAGNOSIS — Z23 Encounter for immunization: Secondary | ICD-10-CM

## 2022-02-14 DIAGNOSIS — Z0000 Encounter for general adult medical examination without abnormal findings: Secondary | ICD-10-CM | POA: Diagnosis not present

## 2022-02-14 DIAGNOSIS — Z1159 Encounter for screening for other viral diseases: Secondary | ICD-10-CM

## 2022-02-14 DIAGNOSIS — Z1239 Encounter for other screening for malignant neoplasm of breast: Secondary | ICD-10-CM

## 2022-02-14 DIAGNOSIS — H6120 Impacted cerumen, unspecified ear: Secondary | ICD-10-CM

## 2022-02-14 DIAGNOSIS — Z1211 Encounter for screening for malignant neoplasm of colon: Secondary | ICD-10-CM

## 2022-02-14 DIAGNOSIS — Z114 Encounter for screening for human immunodeficiency virus [HIV]: Secondary | ICD-10-CM | POA: Diagnosis not present

## 2022-02-14 DIAGNOSIS — Z01818 Encounter for other preprocedural examination: Secondary | ICD-10-CM

## 2022-02-14 DIAGNOSIS — E8889 Other specified metabolic disorders: Secondary | ICD-10-CM | POA: Diagnosis not present

## 2022-02-14 DIAGNOSIS — R233 Spontaneous ecchymoses: Secondary | ICD-10-CM

## 2022-02-14 DIAGNOSIS — Z124 Encounter for screening for malignant neoplasm of cervix: Secondary | ICD-10-CM

## 2022-02-14 NOTE — Progress Notes (Addendum)
? ? ?I,Jana Dovber Ernest,acting as a Education administrator for [employer], PA-C.,have documented all relevant documentation on the behalf of Mardene Speak, PA-C,as directed by  [employer], PA-C while in the presence of Mardene Speak, PA-C.' ? ?Complete physical exam ? ? ?Patient: Ruth Williams   DOB: 1981-01-30   41 y.o. Female  MRN: [id number] ?Visit Date: 02/14/2022 ? ?Today's healthcare provider: Mardene Speak, PA-C  ? ?Chief Complaint  ?Patient presents with  ? Annual Exam  ? Establish Care  ?CC: physical and medical clearance for surgery / cosmetic submental liposuction   ?Subjective  ?  ?Ruth Williams is a 41 y.o. female who presents today for a complete physical exam.  ?She reports consuming a general diet. Gym/ health club routine includes cardio and light weights. She generally feels well. She reports sleeping well. She does have additional problems to discuss today.  ?Patient reports an occasional migraine.  Take Excedrin migraine and goes to sleep with moderate relief. ? ?Has a problem with halitosis and ear wax. Has a hx of frequent ear infections when she was a child. Has a hx of tympanostomy tube placement. Has a hx of frequent pharyngitis and tonsillitis. ? ?Has a hx of rhegmatogenous retinal detachment of right eye ?Has a hx of seasonal allergies ? ?Patient has a hx of allergic reaction to bee stings but she reports no face swelling or difficulty breathing ? ?Per ObGyn chart, last from 11/04/21, ?Breast cancer screening - 09/22/21 ?recommend monthly self breast exam and starting annual screening mammograms at age 56  ?  ?Cervical cancer screening - Pap was done, next in 2025 ?Contraception -Mirena due for replacement. Discussed alternatives. Will return for Mirena IUD replacement in 12/2023. ?  ?Tdap , last in 2013, next in 2023 ?Colon cancer screening: recommended at age 60. ? ?Past Medical History:  ?Diagnosis Date  ? Allergy   ? seasonal  ? Basal cell carcinoma 07/30/2017  ? R nasal dorsum - MOHS  ?  Bleeding from the nose   ? BRCA negative 11/2019  ? MyRisk neg; IBIS=13%; riskscore=16.6%  ? Chicken pox   ? Complication of anesthesia   ? Reports that time she was put to sleep reports that she woke up "crazy"  ? Detached retina 05/2016  ? right  ? Encounter for colonoscopy in patient with family history of colon cancer   ? 1/21 cancer genetic testing letter sent  ? Family history of pancreatic cancer   ? cancer genetic testing neg  ? Migraines   ? SAB (spontaneous abortion) 07/13/2009  ? Shingles   ? x2  ? ?Past Surgical History:  ?Procedure Laterality Date  ? AIR/FLUID EXCHANGE Right 06/10/2016  ? Procedure: AIR/FLUID EXCHANGE;  Surgeon: Hayden Pedro, MD;  Location: [place];  Service: Ophthalmology;  Laterality: Right;  ? BREAST ENHANCEMENT SURGERY  07/13/2014  ? MOHS SURGERY  08/2017  ? basal cell carcinoma removal from nose  ? PHOTOCOAGULATION WITH LASER Bilateral 06/10/2016  ? Procedure: PHOTOCOAGULATION WITH LASER - HEADSCOPE LASER;  Surgeon: Hayden Pedro, MD;  Location: [place];  Service: Ophthalmology;  Laterality: Bilateral;  ? RETINAL DETACHMENT SURGERY    ? right eye  ? SCLERAL BUCKLE Right 06/10/2016  ? Procedure: SCLERAL BUCKLE;  Surgeon: Hayden Pedro, MD;  Location: [place];  Service: Ophthalmology;  Laterality: Right;  ? ?Social History  ? ?Socioeconomic History  ? Marital status: Married  ?  Spouse name: Not on file  ? Number of children: 2  ?  Years of education: Not on file  ? Highest education level: Not on file  ?Occupational History  ? Occupation: Homemaker  ?Tobacco Use  ? Smoking status: Former  ?  Types: Cigarettes  ?  Quit date: 05/09/1999  ?  Years since quitting: 22.7  ? Smokeless tobacco: Never  ?Vaping Use  ? Vaping Use: Never used  ?Substance and Sexual Activity  ? Alcohol use: Yes  ?  Comment: occasional  ? Drug use: No  ? Sexual activity: Yes  ?  Partners: Female  ?  Birth control/protection: I.U.D.  ?  Comment: Patient has slight spotting with IUD  ?Other Topics Concern  ? Not on  file  ?Social History Narrative  ? 1 daughter 4 y.o, 1 son 73 y.o as of 04/08/19  ? Husband Natoria Archibald [phone] DPR  ? ?Social Determinants of Health  ? ?Financial Resource Strain: Not on file  ?Food Insecurity: Not on file  ?Transportation Needs: Not on file  ?Physical Activity: Not on file  ?Stress: Not on file  ?Social Connections: Not on file  ?Intimate Partner Violence: Not on file  ? ?Family Status  ?Relation Name Status  ? Mother  Deceased  ? Father  Deceased at age 63  ? MGM  Deceased  ? PGF  (Not Specified)  ? Mat Aunt  Deceased  ? Sister  Alive  ? H Sister  Alive  ? ?Family History  ?Problem Relation Age of Onset  ? Mental illness Mother   ? Hypertension Mother   ? COPD Mother   ?     smoker died age 17  ? Colon cancer Father 8  ? Bladder Cancer Father 64  ?     bladder to colon her2+ died age 23   ? Diabetes Maternal Grandmother   ? Throat cancer Paternal Grandfather   ? Pancreatic cancer Paternal Grandfather   ? Stomach cancer Maternal Aunt 41  ? Celiac disease Half-Sister   ? ?Allergies  ?Allergen Reactions  ? No Known Allergies   ?  ?Patient Care Team: ?Elberta Leatherwood as PCP - General (Physician Assistant)  ? ?Medications: ?Outpatient Medications Prior to Visit  ?Medication Sig  ? Ascorbic Acid (VITAMIN C PO) Take by mouth.  ? ibuprofen (ADVIL,MOTRIN) 200 MG tablet Take 200 mg by mouth daily as needed for fever (pain).  ? levonorgestrel (MIRENA) 20 MCG/24HR IUD 1 each by Intrauterine route once. Implanted March 2014  ? VITAMIN D PO Take by mouth.  ? ?No facility-administered medications prior to visit.  ? ? ?Review of Systems  ?HENT:  Positive for nosebleeds.   ?Eyes:  Positive for redness.  ?Allergic/Immunologic: Positive for environmental allergies.  ?Neurological:  Positive for headaches.  ?Hematological:  Bruises/bleeds easily.  ?All other systems reviewed and are negative. ? ? Objective  ? ?  ?BP 108/67 (BP Location: Left Arm, Patient Position: Sitting, Cuff Size: Normal)   Pulse  69   Temp 97.9 ?F (36.6 ?C) (Oral)   Resp 16   Ht _0  (1.753 m)   Wt 143 lb 9.6 oz (65.1 kg)   SpO2 100%   BMI 21.21 kg/m?  ? ? ?Physical Exam ?Vitals and nursing note reviewed.  ?Constitutional:   ?   General: She is not in acute distress. ?   Appearance: Normal appearance. She is well-developed. She is not diaphoretic.  ?HENT:  ?   Head: Normocephalic and atraumatic.  ?   Right Ear: There is impacted cerumen.  ?  Left Ear: There is impacted cerumen (partially).  ?   Nose: Nose normal.  ?   Mouth/Throat:  ?   Mouth: Mucous membranes are moist.  ?   Pharynx: Oropharynx is clear. No oropharyngeal exudate.  ?Eyes:  ?   General: No scleral icterus. ?   Conjunctiva/sclera: Conjunctivae normal.  ?   Pupils: Pupils are equal, round, and reactive to light.  ?   Comments: Injected the lower half of the Right conjunctiva of the right eye, after right retinal detachment surgery  ?Neck:  ?   Thyroid: No thyromegaly.  ?Cardiovascular:  ?   Rate and Rhythm: Normal rate and regular rhythm.  ?   Pulses: Normal pulses.  ?   Heart sounds: Normal heart sounds. No murmur heard. ?Pulmonary:  ?   Effort: Pulmonary effort is normal. No respiratory distress.  ?   Breath sounds: Normal breath sounds. No wheezing or rales.  ?Abdominal:  ?   General: There is no distension.  ?   Palpations: Abdomen is soft.  ?   Tenderness: There is no abdominal tenderness.  ?Musculoskeletal:     ?   General: No deformity.  ?   Cervical back: Neck supple.  ?   Right lower leg: No edema.  ?   Left lower leg: No edema.  ?Lymphadenopathy:  ?   Cervical: No cervical adenopathy.  ?Skin: ?   General: Skin is warm and dry.  ?   Findings: No rash.  ?Neurological:  ?   Mental Status: She is alert and oriented to person, place, and time. Mental status is at baseline.  ?   Sensory: No sensory deficit.  ?   Motor: No weakness.  ?   Gait: Gait normal.  ?Psychiatric:     ?   Mood and Affect: Mood normal.     ?   Behavior: Behavior normal.     ?   Thought Content:  Thought content normal.  ?  ? ? ?Last depression screening scores ? ?  02/14/2022  ?  9:52 AM 05/21/2016  ? 10:53 AM  ?PHQ 2/9 Scores  ?PHQ - 2 Score 0 0  ?PHQ- 9 Score 0   ? ?Last fall risk screening ? ?  02/14/2022

## 2022-02-15 LAB — HEMOGLOBIN A1C
Est. average glucose Bld gHb Est-mCnc: 103 mg/dL
Hgb A1c MFr Bld: 5.2 % (ref 4.8–5.6)

## 2022-02-15 LAB — COMPREHENSIVE METABOLIC PANEL
ALT: 23 IU/L (ref 0–32)
AST: 21 IU/L (ref 0–40)
Albumin/Globulin Ratio: 2 (ref 1.2–2.2)
Albumin: 4.9 g/dL — ABNORMAL HIGH (ref 3.8–4.8)
Alkaline Phosphatase: 76 IU/L (ref 44–121)
BUN/Creatinine Ratio: 11 (ref 9–23)
BUN: 9 mg/dL (ref 6–20)
Bilirubin Total: 0.5 mg/dL (ref 0.0–1.2)
CO2: 23 mmol/L (ref 20–29)
Calcium: 9.7 mg/dL (ref 8.7–10.2)
Chloride: 101 mmol/L (ref 96–106)
Creatinine, Ser: 0.8 mg/dL (ref 0.57–1.00)
Globulin, Total: 2.4 g/dL (ref 1.5–4.5)
Glucose: 93 mg/dL (ref 70–99)
Potassium: 4.2 mmol/L (ref 3.5–5.2)
Sodium: 138 mmol/L (ref 134–144)
Total Protein: 7.3 g/dL (ref 6.0–8.5)
eGFR: 96 mL/min/{1.73_m2} (ref >59)

## 2022-02-15 LAB — CBC WITH DIFFERENTIAL/PLATELET
Basophils Absolute: 0 10*3/uL (ref 0.0–0.2)
Basos: 1 %
EOS (ABSOLUTE): 0.1 10*3/uL (ref 0.0–0.4)
Eos: 1 %
Hematocrit: 44 % (ref 34.0–46.6)
Hemoglobin: 14.5 g/dL (ref 11.1–15.9)
Immature Grans (Abs): 0 10*3/uL (ref 0.0–0.1)
Immature Granulocytes: 0 %
Lymphocytes Absolute: 1.4 10*3/uL (ref 0.7–3.1)
Lymphs: 24 %
MCH: 29.8 pg (ref 26.6–33.0)
MCHC: 33 g/dL (ref 31.5–35.7)
MCV: 91 fL (ref 79–97)
Monocytes Absolute: 0.5 10*3/uL (ref 0.1–0.9)
Monocytes: 9 %
Neutrophils Absolute: 3.6 10*3/uL (ref 1.4–7.0)
Neutrophils: 65 %
Platelets: 256 10*3/uL (ref 150–450)
RBC: 4.86 x10E6/uL (ref 3.77–5.28)
RDW: 12 % (ref 11.7–15.4)
WBC: 5.6 10*3/uL (ref 3.4–10.8)

## 2022-02-15 LAB — HEPATITIS C ANTIBODY: Hep C Virus Ab: NONREACTIVE

## 2022-02-15 LAB — PT AND PTT
INR: 1 (ref 0.9–1.2)
Prothrombin Time: 10.4 s (ref 9.1–12.0)
aPTT: 29 s (ref 24–33)

## 2022-02-15 LAB — HIV ANTIBODY (ROUTINE TESTING W REFLEX): HIV Screen 4th Generation wRfx: NONREACTIVE

## 2022-02-20 ENCOUNTER — Telehealth: Payer: Self-pay

## 2022-02-20 NOTE — Telephone Encounter (Signed)
[place]  ?Per Letitia Libra, she would like pt to come in again for a pre-op appt before pt's upcoming surgery for further evaluation if pt agreeable. Okay to [facility] triage to advise.  ?

## 2022-02-21 NOTE — Telephone Encounter (Signed)
Patient called back to inform Mardene Speak that she does not think she need to come in and asking for a call back to discuss what this fu visit would entail. Say that if there are any further issues to be discussed please advise  Ph# [phone] ?

## 2022-02-27 NOTE — Telephone Encounter (Signed)
LM informing pt.

## 2022-03-06 ENCOUNTER — Telehealth: Payer: Self-pay

## 2022-03-06 NOTE — Telephone Encounter (Signed)
Pt office note, labs and surgical clearance letter faxed to [phone].  Left message informing pt.

## 2022-03-06 NOTE — Telephone Encounter (Signed)
Copied from [place] [id number]. Topic: General - Other >> Mar 05, 2022  5:05 PM Yvette Rack wrote: Reason for CRM: Pt stated she was notified that the clearance paperwork and most recent lab results had not been received. Pt requests that the paperwork and lab results be faxed to [facility] fax# [phone]

## 2023-01-22 ENCOUNTER — Other Ambulatory Visit: Payer: Self-pay | Admitting: Licensed Practical Nurse

## 2023-01-22 DIAGNOSIS — Z1231 Encounter for screening mammogram for malignant neoplasm of breast: Secondary | ICD-10-CM

## 2023-02-03 DIAGNOSIS — L821 Other seborrheic keratosis: Secondary | ICD-10-CM | POA: Diagnosis not present

## 2023-02-03 DIAGNOSIS — L814 Other melanin hyperpigmentation: Secondary | ICD-10-CM | POA: Diagnosis not present

## 2023-02-03 DIAGNOSIS — D225 Melanocytic nevi of trunk: Secondary | ICD-10-CM | POA: Diagnosis not present

## 2023-02-03 DIAGNOSIS — Z7189 Other specified counseling: Secondary | ICD-10-CM | POA: Diagnosis not present

## 2023-02-13 ENCOUNTER — Ambulatory Visit
Admission: RE | Admit: 2023-02-13 | Discharge: 2023-02-13 | Disposition: A | Discharge status: Home / Self Care | Payer: BC Managed Care – PPO | Source: Ambulatory Visit | Attending: Licensed Practical Nurse | Admitting: Licensed Practical Nurse

## 2023-02-13 DIAGNOSIS — Z1231 Encounter for screening mammogram for malignant neoplasm of breast: Secondary | ICD-10-CM | POA: Diagnosis not present

## 2023-02-20 ENCOUNTER — Encounter: Payer: Self-pay | Admitting: Physician Assistant

## 2023-02-20 ENCOUNTER — Ambulatory Visit (INDEPENDENT_AMBULATORY_CARE_PROVIDER_SITE_OTHER): Payer: BC Managed Care – PPO | Admitting: Physician Assistant

## 2023-02-20 VITALS — BP 107/79 | HR 79 | Temp 97.1°F | Wt 144.0 lb

## 2023-02-20 DIAGNOSIS — Z8249 Family history of ischemic heart disease and other diseases of the circulatory system: Secondary | ICD-10-CM

## 2023-02-20 DIAGNOSIS — Z0000 Encounter for general adult medical examination without abnormal findings: Secondary | ICD-10-CM

## 2023-02-20 DIAGNOSIS — M25511 Pain in right shoulder: Secondary | ICD-10-CM

## 2023-02-20 DIAGNOSIS — L659 Nonscarring hair loss, unspecified: Secondary | ICD-10-CM

## 2023-02-20 DIAGNOSIS — R6889 Other general symptoms and signs: Secondary | ICD-10-CM

## 2023-02-20 NOTE — Progress Notes (Deleted)
  Established patient visit  Patient: Ruth Williams   DOB: February 06, 1981   42 y.o. Female  MRN: [id number] Visit Date: 02/20/2023  Today's healthcare provider: Debera Lat, PA-C   No chief complaint on file.  Subjective    HPI  ***     02/14/2022    9:52 AM 05/21/2016   10:53 AM  Depression screen PHQ 2/9  Decreased Interest 0 0  Down, Depressed, Hopeless 0 0  PHQ - 2 Score 0 0  Altered sleeping 0   Tired, decreased energy 0   Change in appetite 0   Feeling bad or failure about yourself  0   Trouble concentrating 0   Moving slowly or fidgety/restless 0   Suicidal thoughts 0   PHQ-9 Score 0   Difficult doing work/chores Not difficult at all        No data to display          Medications: Outpatient Medications Prior to Visit  Medication Sig  . Ascorbic Acid (VITAMIN C PO) Take by mouth.  Marland Kitchen ibuprofen (ADVIL,MOTRIN) 200 MG tablet Take 200 mg by mouth daily as needed for fever (pain).  Marland Kitchen levonorgestrel (MIRENA) 20 MCG/24HR IUD 1 each by Intrauterine route once. Implanted March 2014  . VITAMIN D PO Take by mouth.   No facility-administered medications prior to visit.    Review of Systems  All other systems reviewed and are negative. Except see HPI   {Labs  Heme  Chem  Endocrine  Serology  Results Review (optional):23779}   Objective    There were no vitals taken for this visit. {Show previous vital signs (optional):23777}  Physical Exam   No results found for any visits on 02/20/23.  Assessment & Plan    ***  No follow-ups on file.    The patient was advised to call back or seek an in-person evaluation if the symptoms worsen or if the condition fails to improve as anticipated.  I discussed the assessment and treatment plan with the patient. The patient was provided an opportunity to ask questions and all were answered. The patient agreed with the plan and demonstrated an understanding of the instructions.  I, Debera Lat, PA-C have reviewed all  documentation for this visit. The documentation on  02/20/23  for the exam, diagnosis, procedures, and orders are all accurate and complete.  Debera Lat, [facility], MMS [facility] [phone] (phone) [phone] (fax)   [facility]

## 2023-02-20 NOTE — Progress Notes (Unsigned)
Complete physical exam   Patient: Ruth Williams   DOB: 30-Sep-1981   42 y.o. Female  MRN: [id number] Visit Date: 02/20/2023  Today's healthcare provider: Debera Lat, PA-C   Chief Complaint  Patient presents with   Annual Exam   Subjective    Ruth Williams is a 42 y.o. female who presents today for a complete physical exam.  She reports consuming a general diet. Patient exercises at the gym She generally feels well. She reports sleeping well. She does not have additional problems to discuss today.  HPI     02/20/2023   11:22 AM  GAD 7 : Generalized Anxiety Score  Nervous, Anxious, on Edge 1  Control/stop worrying 0  Worry too much - different things 0  Trouble relaxing 0  Restless 0  Easily annoyed or irritable 1  Afraid - awful might happen 0  Total GAD 7 Score 2      Past Medical History:  Diagnosis Date   Allergy    seasonal   Basal cell carcinoma 07/30/2017   R nasal dorsum - MOHS   Bleeding from the nose    BRCA negative 11/2019   MyRisk neg; IBIS=13%; riskscore=16.6%   Chicken pox    Complication of anesthesia    Reports that time she was put to sleep reports that she woke up "crazy"   Detached retina 05/2016   right   Encounter for colonoscopy in patient with family history of colon cancer    1/21 cancer genetic testing letter sent   Family history of pancreatic cancer    cancer genetic testing neg   Migraines    SAB (spontaneous abortion) 07/13/2009   Shingles    x2   Past Surgical History:  Procedure Laterality Date   AIR/FLUID EXCHANGE Right 06/10/2016   Procedure: AIR/FLUID EXCHANGE;  Surgeon: Sherrie George, MD;  Location: [facility] OR;  Service: Ophthalmology;  Laterality: Right;   AUGMENTATION MAMMAPLASTY     BREAST ENHANCEMENT SURGERY  07/13/2014   MOHS SURGERY  08/2017   basal cell carcinoma removal from nose   PHOTOCOAGULATION WITH LASER Bilateral 06/10/2016   Procedure: PHOTOCOAGULATION WITH LASER - HEADSCOPE LASER;   Surgeon: Sherrie George, MD;  Location: [facility] OR;  Service: Ophthalmology;  Laterality: Bilateral;   RETINAL DETACHMENT SURGERY     right eye   SCLERAL BUCKLE Right 06/10/2016   Procedure: SCLERAL BUCKLE;  Surgeon: Sherrie George, MD;  Location: [facility] OR;  Service: Ophthalmology;  Laterality: Right;   Social History   Socioeconomic History   Marital status: Married    Spouse name: Not on file   Number of children: 2   Years of education: Not on file   Highest education level: Not on file  Occupational History   Occupation: Homemaker  Tobacco Use   Smoking status: Former    Types: Cigarettes    Quit date: 05/09/1999    Years since quitting: 23.8   Smokeless tobacco: Never  Vaping Use   Vaping Use: Never used  Substance and Sexual Activity   Alcohol use: Yes    Comment: occasional   Drug use: No   Sexual activity: Yes    Partners: Female    Birth control/protection: I.U.D.    Comment: Patient has slight spotting with IUD  Other Topics Concern   Not on file  Social History Narrative   1 daughter 30 y.o, 1 son 44 y.o as of 04/08/19   Husband Ruth Williams [phone]  DPR   Social Determinants of Health   Financial Resource Strain: Not on file  Food Insecurity: Not on file  Transportation Needs: Not on file  Physical Activity: Sufficiently Active (04/08/2019)   Exercise Vital Sign    Days of Exercise per Week: 4 days    Minutes of Exercise per Session: 60 min  Stress: No Stress Concern Present (04/08/2019)   [employer] of Occupational Health - Occupational Stress Questionnaire    Feeling of Stress : Only a little  Social Connections: Moderately Integrated (04/08/2019)   Social Connection and Isolation Panel [NHANES]    Frequency of Communication with Friends and Family: More than three times a week    Frequency of Social Gatherings with Friends and Family: Once a week    Attends Religious Services: More than 4 times per year    Active Member of [employer] or  Organizations: No    Attends Banker Meetings: Never    Marital Status: Married  Catering manager Violence: Not At Risk (04/08/2019)   Humiliation, Afraid, Rape, and Kick questionnaire    Fear of Current or Ex-Partner: No    Emotionally Abused: No    Physically Abused: No    Sexually Abused: No   Family Status  Relation Name Status   Mother  Deceased   Father  Deceased at age 54   MGM  Deceased   PGF  (Not Specified)   Mat Aunt  Deceased   Sister  Alive   H Sister  Alive   Family History  Problem Relation Age of Onset   Mental illness Mother    Hypertension Mother    COPD Mother        smoker died age 39   Colon cancer Father 69   Bladder Cancer Father 35       bladder to colon her2+ died age 10    Diabetes Maternal Grandmother    Throat cancer Paternal Grandfather    Pancreatic cancer Paternal Grandfather    Stomach cancer Maternal Aunt 30   Celiac disease Half-Sister    Allergies  Allergen Reactions   No Known Allergies     Patient Care Team: Debera Lat, PA-C as PCP - General (Physician Assistant)   Medications: Outpatient Medications Prior to Visit  Medication Sig   Ascorbic Acid (VITAMIN C PO) Take by mouth.   ibuprofen (ADVIL,MOTRIN) 200 MG tablet Take 200 mg by mouth daily as needed for fever (pain).   levonorgestrel (MIRENA) 20 MCG/24HR IUD 1 each by Intrauterine route once. Implanted March 2014   VITAMIN D PO Take by mouth.   No facility-administered medications prior to visit.    Review of Systems  Constitutional:  Positive for diaphoresis (excessive).  HENT:  Positive for congestion (allergy-has ENT appointment).   Eyes: Negative.   Respiratory: Negative.    Cardiovascular: Negative.   Gastrointestinal: Negative.   Endocrine: Negative.   Genitourinary: Negative.   Musculoskeletal:  Positive for arthralgias (shoulders-doing new work out at gym).  Skin: Negative.   Allergic/Immunologic: Negative.   Neurological: Negative.    Hematological: Negative.   Psychiatric/Behavioral: Negative.      {Labs  Heme  Chem  Endocrine  Serology  Results Review (optional):23779}  Objective    BP 107/79 (BP Location: Left Arm, Patient Position: Sitting, Cuff Size: Normal)   Pulse 79   Temp (!) 97.1 F (36.2 C) (Oral)   Wt 144 lb (65.3 kg)   SpO2 100%   BMI 21.27 kg/m  {Show previous  vital signs (optional):23777}   Physical Exam Vitals reviewed.  Constitutional:      General: She is not in acute distress.    Appearance: Normal appearance. She is well-developed. She is not ill-appearing, toxic-appearing or diaphoretic.  HENT:     Head: Normocephalic and atraumatic.     Right Ear: Tympanic membrane, ear canal and external ear normal.     Left Ear: Tympanic membrane, ear canal and external ear normal.     Nose: Congestion present. No rhinorrhea.     Mouth/Throat:     Mouth: Mucous membranes are moist.     Pharynx: Oropharynx is clear. No oropharyngeal exudate or posterior oropharyngeal erythema.  Eyes:     General: No scleral icterus.       Right eye: No discharge.        Left eye: No discharge.     Conjunctiva/sclera: Conjunctivae normal.     Pupils: Pupils are equal, round, and reactive to light.  Neck:     Thyroid: No thyromegaly.     Vascular: No carotid bruit.  Cardiovascular:     Rate and Rhythm: Normal rate and regular rhythm.     Pulses: Normal pulses.     Heart sounds: Normal heart sounds. No murmur heard.    No friction rub. No gallop.  Pulmonary:     Effort: Pulmonary effort is normal. No respiratory distress.     Breath sounds: Normal breath sounds. No stridor. No wheezing, rhonchi or rales.  Chest:     Chest wall: No tenderness.  Abdominal:     General: Abdomen is flat. Bowel sounds are normal. There is no distension.     Palpations: Abdomen is soft. There is no mass.     Tenderness: There is no abdominal tenderness. There is no right CVA tenderness, left CVA tenderness, guarding or  rebound.     Hernia: No hernia is present.  Musculoskeletal:        General: No swelling, tenderness, deformity or signs of injury. Normal range of motion.     Cervical back: Normal range of motion and neck supple. No rigidity or tenderness.     Right lower leg: No edema.     Left lower leg: No edema.  Lymphadenopathy:     Cervical: No cervical adenopathy.  Skin:    General: Skin is warm and dry.     Capillary Refill: Capillary refill takes less than 2 seconds.     Coloration: Skin is not jaundiced or pale.     Findings: No bruising, erythema, lesion or rash.  Neurological:     Mental Status: She is alert and oriented to person, place, and time. Mental status is at baseline.     Cranial Nerves: No cranial nerve deficit.     Sensory: No sensory deficit.     Motor: No weakness.     Coordination: Coordination normal.     Gait: Gait normal.     Deep Tendon Reflexes: Reflexes normal.  Psychiatric:        Mood and Affect: Mood normal.        Behavior: Behavior normal.        Thought Content: Thought content normal.        Judgment: Judgment normal.     Last depression screening scores    02/20/2023   10:31 AM 02/14/2022    9:52 AM 05/21/2016   10:53 AM  PHQ 2/9 Scores  PHQ - 2 Score 0 0 0  PHQ- 9 Score 0  0    Last fall risk screening    02/20/2023   10:23 AM  Fall Risk   Falls in the past year? 0  Number falls in past yr: 0  Injury with Fall? 0   Last Audit-C alcohol use screening    02/20/2023   10:23 AM  Alcohol Use Disorder Test (AUDIT)  1. How often do you have a drink containing alcohol? 2  2. How many drinks containing alcohol do you have on a typical day when you are drinking? 0  3. How often do you have six or more drinks on one occasion? 0  AUDIT-C Score 2   A score of 3 or more in women, and 4 or more in men indicates increased risk for alcohol abuse, EXCEPT if all of the points are from question 1   No results found for any visits on 02/20/23.  Assessment &  Plan    Routine Health Maintenance and Physical Exam  Exercise Activities and Dietary recommendations  Goals   None     Immunization History  Administered Date(s) Administered   Influenza Inj Mdck Quad Pf 07/28/2017   Influenza,inj,Quad PF,6+ Mos 08/23/2015, 08/04/2018   Influenza-Unspecified 08/23/2015, 07/26/2019   Td 10/14/2007, 02/14/2022   Tdap 11/12/2011    Health Maintenance  Topic Date Due   COVID-19 Vaccine (1) Never done   INFLUENZA VACCINE  05/14/2023   PAP SMEAR-Modifier  11/04/2024   DTaP/Tdap/Td (4 - Td or Tdap) 02/15/2032   Hepatitis C Screening  Completed   HIV Screening  Completed   HPV VACCINES  Aged Out    Discussed health benefits of physical activity, and encouraged her to engage in regular exercise appropriate for her age and condition. Annual physical UTD on dental/eye Things to do to keep yourself healthy  - Exercise at least 30-45 minutes a day, 3-4 days a week.  - Eat a low-fat diet with lots of fruits and vegetables, up to 7-9 servings per day.  - Seatbelts can save your life. Wear them always.  - Smoke detectors on every level of your home, check batteries every year.  - Eye Doctor - have an eye exam every 1-2 years  - Safe sex - if you may be exposed to STDs, use a condom.  - Alcohol -  If you drink, do it moderately, less than 2 drinks per day.  - Health Care Power of Attorney. Choose someone to speak for you if you are not able.  - Depression is common in our stressful world.If you're feeling down or losing interest in things you normally enjoy, please come in for a visit.  - Violence - If anyone is threatening or hurting you, please call immediately. - Comprehensive Metabolic Panel (CMET) - CBC with Differential/Platelet - Lipid Panel With LDL/HDL Ratio - TSH  2. Hair loss 3. Heat intolerance Chronic, could be due to thyroid, endocrine, mental problems Initial workup - Comprehensive Metabolic Panel (CMET) - CBC with  Differential/Platelet - Lipid Panel With LDL/HDL Ratio - TSH Will reassess after  receiving lab results  4. Family history of cardiovascular disease - Comprehensive Metabolic Panel (CMET) - CBC with Differential/Platelet - Lipid Panel With LDL/HDL Ratio - TSH   Return in about 1 year (around 02/20/2024) for CPE.     The patient was advised to call back or seek an in-person evaluation if the symptoms worsen or if the condition fails to improve as anticipated.  I discussed the assessment and treatment plan with the patient.  The patient was provided an opportunity to ask questions and all were answered. The patient agreed with the plan and demonstrated an understanding of the instructions.  I, Debera Lat, PA-C have reviewed all documentation for this visit. The documentation on  02/20/23 for the exam, diagnosis, procedures, and orders are all accurate and complete.  Debera Lat, [facility], MMS [facility] [phone] (phone) [phone] (fax)   [facility]

## 2023-02-23 DIAGNOSIS — Z8249 Family history of ischemic heart disease and other diseases of the circulatory system: Secondary | ICD-10-CM | POA: Diagnosis not present

## 2023-02-23 DIAGNOSIS — Z0000 Encounter for general adult medical examination without abnormal findings: Secondary | ICD-10-CM | POA: Diagnosis not present

## 2023-02-23 DIAGNOSIS — L659 Nonscarring hair loss, unspecified: Secondary | ICD-10-CM | POA: Diagnosis not present

## 2023-02-23 DIAGNOSIS — R6889 Other general symptoms and signs: Secondary | ICD-10-CM | POA: Diagnosis not present

## 2023-02-24 LAB — LIPID PANEL WITH LDL/HDL RATIO
Cholesterol, Total: 162 mg/dL (ref 100–199)
HDL: 78 mg/dL (ref >39)
LDL Chol Calc (NIH): 73 mg/dL (ref 0–99)
LDL/HDL Ratio: 0.9 ratio (ref 0.0–3.2)
Triglycerides: 56 mg/dL (ref 0–149)
VLDL Cholesterol Cal: 11 mg/dL (ref 5–40)

## 2023-02-24 LAB — COMPREHENSIVE METABOLIC PANEL
ALT: 19 IU/L (ref 0–32)
AST: 17 IU/L (ref 0–40)
Albumin/Globulin Ratio: 2.2 (ref 1.2–2.2)
Albumin: 4.7 g/dL (ref 3.9–4.9)
Alkaline Phosphatase: 86 IU/L (ref 44–121)
BUN/Creatinine Ratio: 12 (ref 9–23)
BUN: 10 mg/dL (ref 6–24)
Bilirubin Total: 0.5 mg/dL (ref 0.0–1.2)
CO2: 24 mmol/L (ref 20–29)
Calcium: 9.5 mg/dL (ref 8.7–10.2)
Chloride: 102 mmol/L (ref 96–106)
Creatinine, Ser: 0.84 mg/dL (ref 0.57–1.00)
Globulin, Total: 2.1 g/dL (ref 1.5–4.5)
Glucose: 88 mg/dL (ref 70–99)
Potassium: 4.4 mmol/L (ref 3.5–5.2)
Sodium: 139 mmol/L (ref 134–144)
Total Protein: 6.8 g/dL (ref 6.0–8.5)
eGFR: 90 mL/min/{1.73_m2} (ref >59)

## 2023-02-24 LAB — CBC WITH DIFFERENTIAL/PLATELET
Basophils Absolute: 0 10*3/uL (ref 0.0–0.2)
Basos: 1 %
EOS (ABSOLUTE): 0.1 10*3/uL (ref 0.0–0.4)
Eos: 2 %
Hematocrit: 43.7 % (ref 34.0–46.6)
Hemoglobin: 14.2 g/dL (ref 11.1–15.9)
Immature Grans (Abs): 0 10*3/uL (ref 0.0–0.1)
Immature Granulocytes: 0 %
Lymphocytes Absolute: 1.1 10*3/uL (ref 0.7–3.1)
Lymphs: 22 %
MCH: 29.3 pg (ref 26.6–33.0)
MCHC: 32.5 g/dL (ref 31.5–35.7)
MCV: 90 fL (ref 79–97)
Monocytes Absolute: 0.4 10*3/uL (ref 0.1–0.9)
Monocytes: 9 %
Neutrophils Absolute: 3.2 10*3/uL (ref 1.4–7.0)
Neutrophils: 66 %
Platelets: 261 10*3/uL (ref 150–450)
RBC: 4.85 x10E6/uL (ref 3.77–5.28)
RDW: 12.2 % (ref 11.7–15.4)
WBC: 4.8 10*3/uL (ref 3.4–10.8)

## 2023-02-24 LAB — TSH: TSH: 1.03 u[IU]/mL (ref 0.450–4.500)

## 2023-02-24 NOTE — Progress Notes (Signed)
Hello Ruth Williams ,   Your labwork results all are within normal limits.  No changes need to be made to medications, and no further tests need to be ordered.  Any questions please reach out to the office or message me on MyChart!  Best, Debera Lat, PA-C

## 2023-02-25 DIAGNOSIS — J301 Allergic rhinitis due to pollen: Secondary | ICD-10-CM | POA: Diagnosis not present

## 2023-02-25 DIAGNOSIS — R040 Epistaxis: Secondary | ICD-10-CM | POA: Diagnosis not present

## 2023-02-25 DIAGNOSIS — H6123 Impacted cerumen, bilateral: Secondary | ICD-10-CM | POA: Diagnosis not present

## 2023-06-24 ENCOUNTER — Ambulatory Visit: Payer: Self-pay | Admitting: *Deleted

## 2023-06-24 NOTE — Telephone Encounter (Signed)
  Chief Complaint: Nagging little cough, congestion in my throat that is making me clear my throat all the time that is not  going away after having Covid a week and a half ago.   My other symptoms are gone. Symptoms: Congestion in her throat that is causing her to clear her throat frequently and have a nagging mild cough.   Couth is non productive. Frequency: Since having Covid Pertinent Negatives: Patient denies fever or nasal congestion at this point.   Disposition: [] ED /[] Urgent Care (no appt availability in office) / [] Appointment(In office/virtual)/ []  [facility]/ [x] Home Care/ [] Refused Recommended Disposition /[] [facility]/ []  Follow-up with PCP Additional Notes: I offered her some OTC suggestions as she requested.   I recommended throat lozenges for coughing, sipping on warm fluids, continue using the saline nasal spray, Afrin nasal spray or a OTC steroid nasal spray may be helpful with the congestion she is having in her throat.    She thanked me very much for my suggestions.   She did not want an appt. At this time but will call back if these OTC measures do not help.   I let her know it may take another week or so for this symptom to resolve after having Covid.

## 2023-06-24 NOTE — Telephone Encounter (Signed)
Message from Lennox Pippins sent at 06/24/2023  8:18 AM EDT  Summary: nagging cough that won't go away   Patient called and stated she had covid about a week and a half ago and has a nagging cough that won't go away. Offered an appt today at 4pm with [facility] and she stated she can not do 4pm. Patient then requested something OTC to take for this. Please advise.   Patients callback # [phone]          Call History  Contact Date/Time Type Contact Phone/Fax User  06/24/2023 08:16 AM EDT Phone (Incoming) Ruth Williams, Ruth Williams (Self) [phone] Rexene Edison) Muck, Army Melia   Reason for Disposition  Cough with cold symptoms (e.g., runny nose, postnasal drip, throat clearing)  Answer Assessment - Initial Assessment Questions 1. ONSET: "When did the cough begin?"      I had Covid a week and a half ago.   I can't get rid of this cough.   It's non productive.  It's just a nagging, clearing my throat kid of cough that is not going away.   My other symptoms have resolved.   Do you have any suggestions for OTC medication I can use for this little nagging cough.   (She was clearing her throat a lot during the call). 2. SEVERITY: "How bad is the cough today?"      It's just not going away.  Just a nagging cough Every now and then I blow my nose.   So no nasal congestion.   It's just this annoying cough.    3. SPUTUM: "Describe the color of your sputum" (none, dry cough; clear, white, yellow, green)     Nothing comes up 4. HEMOPTYSIS: "Are you coughing up any blood?" If so ask: "How much?" (flecks, streaks, tablespoons, etc.)     Not asked 5. DIFFICULTY BREATHING: "Are you having difficulty breathing?" If Yes, ask: "How bad is it?" (e.g., mild, moderate, severe)    - MILD: No SOB at rest, mild SOB with walking, speaks normally in sentences, can lie down, no retractions, pulse < 100.    - MODERATE: SOB at rest, SOB with minimal exertion and prefers to sit, cannot lie down flat, speaks in phrases, mild retractions,  audible wheezing, pulse 100-120.    - SEVERE: Very SOB at rest, speaks in single words, struggling to breathe, sitting hunched forward, retractions, pulse > 120      No shortness of breath or chest pain.    I had one brief episode last night where I felt a little short of breath while I was laying down but nothing like that this morning. 6. FEVER: "Do you have a fever?" If Yes, ask: "What is your temperature, how was it measured, and when did it start?"     No 7. CARDIAC HISTORY: "Do you have any history of heart disease?" (e.g., heart attack, congestive heart failure)      Not asked 8. LUNG HISTORY: "Do you have any history of lung disease?"  (e.g., pulmonary embolus, asthma, emphysema)     Not asked 9. PE RISK FACTORS: "Do you have a history of blood clots?" (or: recent major surgery, recent prolonged travel, bedridden)     Not asked 10. OTHER SYMPTOMS: "Do you have any other symptoms?" (e.g., runny nose, wheezing, chest pain)       Last night at one point I felt like I was short of breath laying down but it passed.   Denies chest pain or shortness  of breath today.  No sore throat or fever.   Just this nagging cough because of mucus in my throat.   I don't feel like anything is draining down the back of my throat.   My chest feels clear. 11. PREGNANCY: "Is there any chance you are pregnant?" "When was your last menstrual period?"       Not asked 12. TRAVEL: "Have you traveled out of the country in the last month?" (e.g., travel history, exposures)       Not asked.  Protocols used: Cough - Acute Non-Productive-A-AH

## 2023-06-25 ENCOUNTER — Ambulatory Visit (INDEPENDENT_AMBULATORY_CARE_PROVIDER_SITE_OTHER): Payer: BC Managed Care – PPO | Admitting: Physician Assistant

## 2023-06-25 VITALS — BP 112/78 | HR 79 | Ht 69.0 in | Wt 144.1 lb

## 2023-06-25 DIAGNOSIS — U099 Post covid-19 condition, unspecified: Secondary | ICD-10-CM | POA: Diagnosis not present

## 2023-06-25 DIAGNOSIS — R059 Cough, unspecified: Secondary | ICD-10-CM

## 2023-06-27 ENCOUNTER — Encounter: Payer: Self-pay | Admitting: Physician Assistant

## 2023-06-27 NOTE — Progress Notes (Signed)
Established patient visit  Patient: Ruth Williams   DOB: 08/01/1981   42 y.o. Female  MRN: [id number] Visit Date: 06/25/2023  Today's healthcare provider: Debera Lat, PA-C   Chief Complaint  Patient presents with   Cough    Lingering since covid positive a week and a half ago. Throat congestion but no other symptoms. Reports started feeling a little achy overnight and having some low energy today. Started taking delsyn last night and then again this morning that she reports is helping some   Subjective      Discussed the use of AI scribe software for clinical note transcription with the patient, who gave verbal consent to proceed.  History of Present Illness   The patient, with a recent history of COVID-19, presents with a persistent, non-productive cough that began on Tuesday. The cough is described as 'annoying' and interferes with sleep. Accompanying the cough, the patient reports body aches that began the previous night. The patient denies fever and sore throat. The patient also reports nasal congestion and post-nasal drainage. The patient had been active, exercising and moving items around the house, but has since been resting at home. The patient's husband had been sick and tested positive for COVID-19, which is likely the source of the patient's initial infection.           02/20/2023   10:31 AM 02/14/2022    9:52 AM 05/21/2016   10:53 AM  Depression screen PHQ 2/9  Decreased Interest 0 0 0  Down, Depressed, Hopeless 0 0 0  PHQ - 2 Score 0 0 0  Altered sleeping 0 0   Tired, decreased energy 0 0   Change in appetite 0 0   Feeling bad or failure about yourself  0 0   Trouble concentrating 0 0   Moving slowly or fidgety/restless 0 0   Suicidal thoughts 0 0   PHQ-9 Score 0 0   Difficult doing work/chores Not difficult at all Not difficult at all       02/20/2023   11:22 AM  GAD 7 : Generalized Anxiety Score  Nervous, Anxious, on Edge 1  Control/stop worrying 0   Worry too much - different things 0  Trouble relaxing 0  Restless 0  Easily annoyed or irritable 1  Afraid - awful might happen 0  Total GAD 7 Score 2    Medications: Outpatient Medications Prior to Visit  Medication Sig   levonorgestrel (MIRENA) 20 MCG/24HR IUD 1 each by Intrauterine route once. Implanted March 2014   Ascorbic Acid (VITAMIN C PO) Take by mouth. (Patient not taking: Reported on 06/25/2023)   ibuprofen (ADVIL,MOTRIN) 200 MG tablet Take 200 mg by mouth daily as needed for fever (pain). (Patient not taking: Reported on 06/25/2023)   VITAMIN D PO Take by mouth. (Patient not taking: Reported on 06/25/2023)   No facility-administered medications prior to visit.    Review of Systems  Respiratory:  Positive for cough.   All other systems reviewed and are negative.  Except see HPI       Objective    BP 112/78 (BP Location: Right Arm, Patient Position: Sitting, Cuff Size: Normal)   Pulse 79   Ht 5\' 9"  (1.753 m)   Wt 144 lb 1.6 oz (65.4 kg)   SpO2 98%   BMI 21.28 kg/m     Physical Exam Vitals reviewed.  Constitutional:      General: She is not in acute distress.    Appearance: Normal appearance. She  is well-developed. She is not diaphoretic.  HENT:     Head: Normocephalic and atraumatic.     Right Ear: Ear canal and external ear normal.     Left Ear: Ear canal and external ear normal.     Ears:     Comments: Fluids behind the TMs    Nose: Congestion (mild) and rhinorrhea (mild) present.     Mouth/Throat:     Pharynx: No posterior oropharyngeal erythema.     Comments: Postnasal drainage Eyes:     General: No scleral icterus.       Right eye: No discharge.        Left eye: No discharge.     Extraocular Movements: Extraocular movements intact.     Conjunctiva/sclera: Conjunctivae normal.     Pupils: Pupils are equal, round, and reactive to light.  Neck:     Thyroid: No thyromegaly.  Cardiovascular:     Rate and Rhythm: Normal rate and regular rhythm.      Pulses: Normal pulses.     Heart sounds: Normal heart sounds. No murmur heard. Pulmonary:     Effort: Pulmonary effort is normal. No respiratory distress.     Breath sounds: Normal breath sounds. No wheezing, rhonchi or rales.  Musculoskeletal:     Cervical back: Neck supple.     Right lower leg: No edema.     Left lower leg: No edema.  Lymphadenopathy:     Cervical: No cervical adenopathy.  Skin:    General: Skin is warm and dry.     Findings: No rash.  Neurological:     Mental Status: She is alert and oriented to person, place, and time. Mental status is at baseline.  Psychiatric:        Mood and Affect: Mood normal.        Behavior: Behavior normal.      No results found for any visits on 06/25/23.  Assessment & Plan        Post-COVID-19 Syndrome Persistent dry cough and body aches following recent COVID-19 infection. No fever or difficulty breathing. Nasal congestion and post-nasal drainage noted on examination. -Start Flonase, two puffs in each nostril for one week, then decrease to one puff in each nostril until symptoms subside. -Use nasal saline rinse or spray before Flonase. -Start Mucinex DM to help make cough more productive. -Consider over-the-counter antihistamines such as Allegra or Claritin. -Continue warm salt gargles. -Use steam inhalation /tenting. -Consider foot massages / reflexology. -Use hot tea with honey for symptomatic relief of cough, sore throat if needed -Limit use of Afrin to no more than three days to avoid rebound congestion. -Contact office if symptoms worsen or fever develops.      No follow-ups on file.     The patient was advised to call back or seek an in-person evaluation if the symptoms worsen or if the condition fails to improve as anticipated.  I discussed the assessment and treatment plan with the patient. The patient was provided an opportunity to ask questions and all were answered. The patient agreed with the plan and  demonstrated an understanding of the instructions.  I, Debera Lat, PA-C have reviewed all documentation for this visit. The documentation on  06/25/23 for the exam, diagnosis, procedures, and orders are all accurate and complete.  Debera Lat, [facility], MMS [facility] [phone] (phone) [phone] (fax)  [facility]

## 2023-06-30 DIAGNOSIS — J208 Acute bronchitis due to other specified organisms: Secondary | ICD-10-CM | POA: Diagnosis not present

## 2023-06-30 DIAGNOSIS — B9689 Other specified bacterial agents as the cause of diseases classified elsewhere: Secondary | ICD-10-CM | POA: Diagnosis not present

## 2023-11-11 ENCOUNTER — Encounter: Payer: Self-pay | Admitting: Licensed Practical Nurse

## 2023-11-11 ENCOUNTER — Ambulatory Visit (INDEPENDENT_AMBULATORY_CARE_PROVIDER_SITE_OTHER): Payer: BC Managed Care – PPO | Admitting: Licensed Practical Nurse

## 2023-11-11 ENCOUNTER — Other Ambulatory Visit (HOSPITAL_COMMUNITY)
Admission: RE | Admit: 2023-11-11 | Discharge: 2023-11-11 | Disposition: A | Discharge status: Home / Self Care | Payer: BC Managed Care – PPO | Source: Ambulatory Visit | Attending: Licensed Practical Nurse | Admitting: Licensed Practical Nurse

## 2023-11-11 VITALS — BP 112/61 | HR 71 | Ht 69.0 in | Wt 142.6 lb

## 2023-11-11 DIAGNOSIS — R232 Flushing: Secondary | ICD-10-CM

## 2023-11-11 DIAGNOSIS — Z01419 Encounter for gynecological examination (general) (routine) without abnormal findings: Secondary | ICD-10-CM

## 2023-11-11 DIAGNOSIS — Z113 Encounter for screening for infections with a predominantly sexual mode of transmission: Secondary | ICD-10-CM | POA: Insufficient documentation

## 2023-11-11 DIAGNOSIS — Z01411 Encounter for gynecological examination (general) (routine) with abnormal findings: Secondary | ICD-10-CM | POA: Diagnosis not present

## 2023-11-11 DIAGNOSIS — N920 Excessive and frequent menstruation with regular cycle: Secondary | ICD-10-CM

## 2023-11-11 DIAGNOSIS — R399 Unspecified symptoms and signs involving the genitourinary system: Secondary | ICD-10-CM

## 2023-11-11 DIAGNOSIS — Z30431 Encounter for routine checking of intrauterine contraceptive device: Secondary | ICD-10-CM

## 2023-11-11 LAB — POCT URINALYSIS DIPSTICK
Bilirubin, UA: NEGATIVE
Blood, UA: NEGATIVE
Glucose, UA: NEGATIVE
Ketones, UA: NEGATIVE
Leukocytes, UA: NEGATIVE
Nitrite, UA: NEGATIVE
Protein, UA: POSITIVE — AB
Spec Grav, UA: 1.02 (ref 1.010–1.025)
Urobilinogen, UA: 0.2 U/dL
pH, UA: 7.5 (ref 5.0–8.0)

## 2023-11-11 MED ORDER — LO LOESTRIN FE 1 MG-10 MCG / 10 MCG PO TABS: 1.0000 | ORAL_TABLET | Freq: Every day | ORAL | Status: DC

## 2023-11-11 NOTE — Progress Notes (Signed)
Gynecology Annual Exam  PCP: Debera Lat, PA-C  Chief Complaint:  Chief Complaint  Patient presents with   Gynecologic Exam    History of Present Illness: Patient is a 43 y.o. Ruth Williams presents for annual exam. The patient has some concerns today   -Spotting, normally has brown spotting monthly when she thinks a cycle would be due, but recently she had spotting that occurred more frequently, and when she wiped she saw pink tinged discharge. Denies any changes to her discharge, may have had some odor at one time.   -Urinary sxs: has had the sensation that she needed to urinate frequently, this lasted for 2 weeks, this symptoms has improved. She has not made any changes to her diet, she does not consume much caffeine, she does not drink enough fluids daily, denies fevers, back pain, or dysuria.   -Perimenopause: Symptoms have been going on for a few years, but now they are more frequent (now occur weekly) and bothersome, she experiences hot flashes/sweats and mood changes, mainly anxiety. She states "I have nothing to be anxious about", she feels she is constantly "low grade on edge", she likes to exercise and pray to manage symptoms. She gets "good sleep"  LMP: No LMP recorded. (Menstrual status: IUD). Average Interval: regular, monthly,   Intermenstrual Bleeding: no Postcoital Bleeding: no Dysmenorrhea: no   The patient is sexually active with 1 female partner . She currently uses IUD for contraception. She denies dyspareunia.  The patient does perform self breast exams.  There is no notable family history of breast or ovarian cancer in her family.  The patient wears seatbelts: yes.   The patient has regular exercise: yes.  Goes to the gym 3 times per week  Is a [facility]  Lives with her husband and children ages 22 and 59, feels safe at home PCP last seen in May Dentist up to date Eye exam up to date    The patient reports current symptoms of depression.  See HPI   Review of  Systems: ROSsee HPI   Past Medical History:  Patient Active Problem List   Diagnosis Date Noted Date Diagnosed   Basal cell carcinoma      right nose s/p mohs in 2018 Dr. Neale Burly is dermatologist    Family history of colon cancer in father 12/28/2017     Refer for colonoscopy at age 69.    Rhegmatogenous retinal detachment of right eye 06/10/2016    Retinal break of left eye 06/10/2016    Right hip pain 05/21/2016    Visual disturbance of one eye 05/21/2016    Encounter for counseling 05/21/2016     Past Surgical History:  Past Surgical History:  Procedure Laterality Date   AIR/FLUID EXCHANGE Right 06/10/2016   Procedure: AIR/FLUID EXCHANGE;  Surgeon: Sherrie George, MD;  Location: [facility] OR;  Service: Ophthalmology;  Laterality: Right;   AUGMENTATION MAMMAPLASTY     BREAST ENHANCEMENT SURGERY  07/13/2014   MOHS SURGERY  08/2017   basal cell carcinoma removal from nose   PHOTOCOAGULATION WITH LASER Bilateral 06/10/2016   Procedure: PHOTOCOAGULATION WITH LASER - HEADSCOPE LASER;  Surgeon: Sherrie George, MD;  Location: [facility] OR;  Service: Ophthalmology;  Laterality: Bilateral;   RETINAL DETACHMENT SURGERY     right eye   SCLERAL BUCKLE Right 06/10/2016   Procedure: SCLERAL BUCKLE;  Surgeon: Sherrie George, MD;  Location: [facility] OR;  Service: Ophthalmology;  Laterality: Right;    Gynecologic History:  No LMP recorded. (  Menstrual status: IUD). Contraception: IUD Last Pap: Results were: 10/2021 no abnormalities  Last mammogram: 02/2023 Results were: BI-RAD I  Obstetric History: [id number]  Family History:  Family History  Problem Relation Age of Onset   Mental illness Mother    Hypertension Mother    COPD Mother        smoker died age 73   Colon cancer Father 16   Bladder Cancer Father 75       bladder to colon her2+ died age 22    Diabetes Maternal Grandmother    Throat cancer Paternal Grandfather    Pancreatic cancer Paternal Grandfather    Stomach cancer Maternal Aunt 30    Celiac disease Half-Sister     Social History:  Social History   Socioeconomic History   Marital status: Married    Spouse name: Not on file   Number of children: 2   Years of education: Not on file   Highest education level: Not on file  Occupational History   Occupation: Homemaker  Tobacco Use   Smoking status: Former    Current packs/day: 0.00    Types: Cigarettes    Quit date: 05/09/1999    Years since quitting: 24.5   Smokeless tobacco: Never  Vaping Use   Vaping status: Never Used  Substance and Sexual Activity   Alcohol use: Yes    Comment: occasional   Drug use: No   Sexual activity: Yes    Partners: Female    Birth control/protection: I.U.D.    Comment: Patient has slight spotting with IUD  Other Topics Concern   Not on file  Social History Narrative   1 daughter 53 y.o, 1 son 31 y.o as of 04/08/19   Husband Latrina Guttman [phone] DPR   Social Drivers of Health   Financial Resource Strain: Not on file  Food Insecurity: Not on file  Transportation Needs: Not on file  Physical Activity: Sufficiently Active (04/08/2019)   Exercise Vital Sign    Days of Exercise per Week: 4 days    Minutes of Exercise per Session: 60 min  Stress: No Stress Concern Present (04/08/2019)   [employer] of Occupational Health - Occupational Stress Questionnaire    Feeling of Stress : Only a little  Social Connections: Unknown (06/30/2023)   Received from [facility]   Social Network    Social Network: Not on file  Intimate Partner Violence: Unknown (06/30/2023)   Received from [facility]   HITS    Physically Hurt: Not on file    Insult or Talk Down To: Not on file    Threaten Physical Harm: Not on file    Scream or Curse: Not on file    Allergies:  Allergies  Allergen Reactions   No Known Allergies     Medications: Prior to Admission medications   Medication Sig Start Date End Date Taking? Authorizing Provider  levonorgestrel (MIRENA) 20 MCG/24HR IUD 1  each by Intrauterine route once. Implanted March 2014   Yes [provider]  Norethindrone-Ethinyl Estradiol-Fe Biphas (LO LOESTRIN FE) 1 MG-10 MCG / 10 MCG tablet Take 1 tablet by mouth daily. 11/11/23  Yes Susumu Hackler, Courtney Heys, CNM    Physical Exam Vitals: Blood pressure 112/61, pulse 71, height 5\' 9"  (1.753 m), weight 142 lb 9.6 oz (64.7 kg).  General: NAD HEENT: normocephalic, anicteric Thyroid: no enlargement, no palpable nodules Pulmonary: No increased work of breathing, CTAB Cardiovascular: RRR, distal pulses 2+ Breast (Implants): Breast symmetrical, no tenderness, no  palpable nodules or masses, no skin or nipple retraction present, no nipple discharge.  No axillary or supraclavicular lymphadenopathy. Abdomen: NABS, soft, non-tender, non-distended.  Umbilicus without lesions.  No hepatomegaly, splenomegaly or masses palpable. No evidence of hernia  Genitourinary:  External: Normal external female genitalia.  Normal urethral meatus, normal Bartholin's and Skene's glands.    Vagina: Normal vaginal mucosa, no evidence of prolapse.    Cervix: Grossly normal in appearance-small cyst at 5 O'clock, no bleeding IUD strings not visualized  Uterus: slightly larger than expected, mobile, normal contour.  No CMT. IUD strings not palpated.   Adnexa: ovaries non-enlarged, no adnexal masses  Rectal: deferred  Lymphatic: no evidence of inguinal lymphadenopathy Extremities: no edema, erythema, or tenderness Neurologic: Grossly intact Psychiatric: mood appropriate, affect full  Assessment: 43 y.o. [id number] routine annual exam  Plan: Problem List Items Addressed This Visit   None Visit Diagnoses       Well woman exam    -  Primary     UTI symptoms       Relevant Orders   POCT Urinalysis Dipstick (Completed)   Urine Culture     Spotting       Relevant Orders   Cervicovaginal ancillary only     Hot flashes       Relevant Orders   Estradiol   FSH/LH     IUD check up        Relevant Orders   US PELVIC COMPLETE WITH TRANSVAGINAL       1) Mammogram - recommend yearly screening mammogram.  Mammogram Is up to date. Will call and schedule later this year.    2) STI screening  wasoffered and  swabs obtained  3) ASCCP guidelines and rational discussed.  Patient opts for every 5 years screening interval  4) Contraception - the patient is currently using  IUD.  She is happy with her current form of contraception and plans to continue. Consider changing out IUD if bleeding profile continues.   5) Colonoscopy -- Screening recommended starting at age 61 for average risk individuals, age 7 for individuals deemed at increased risk (including African Americans) and recommended to continue until age 21.  For patient age 17-85 individualized approach is recommended.  Gold standard screening is via colonoscopy, Cologuard screening is an acceptable alternative for patient unwilling or unable to undergo colonoscopy.  "Colorectal cancer screening for average?risk adults: 2018 guideline update from the American Cancer Society"CA: A Cancer Journal for Clinicians: Mar 11, 2017   6) Routine healthcare maintenance including cholesterol, diabetes screening discussed managed by PCP  7) Perimenopause: TSH labs WNL in May. Sxs most likely related to perimenopause. Sample of LoLoestrin given x 3,  Return in about 3 months (around 02/09/2024). If there is no improvement in mood, consider other medication.   Carie Caddy, CNM  [facility] 11/11/2023, 11:57 AM

## 2023-11-12 ENCOUNTER — Ambulatory Visit
Admission: RE | Admit: 2023-11-12 | Discharge: 2023-11-12 | Disposition: A | Discharge status: Home / Self Care | Payer: BC Managed Care – PPO | Source: Ambulatory Visit | Attending: Licensed Practical Nurse | Admitting: Licensed Practical Nurse

## 2023-11-12 DIAGNOSIS — Z960 Presence of urogenital implants: Secondary | ICD-10-CM | POA: Diagnosis not present

## 2023-11-12 DIAGNOSIS — Z30431 Encounter for routine checking of intrauterine contraceptive device: Secondary | ICD-10-CM | POA: Insufficient documentation

## 2023-11-12 LAB — CERVICOVAGINAL ANCILLARY ONLY
Bacterial Vaginitis (gardnerella): NEGATIVE
Candida Glabrata: NEGATIVE
Candida Vaginitis: NEGATIVE
Chlamydia: NEGATIVE
Comment: NEGATIVE
Comment: NEGATIVE
Comment: NEGATIVE
Comment: NEGATIVE
Comment: NEGATIVE
Comment: NORMAL
Neisseria Gonorrhea: NEGATIVE
Trichomonas: NEGATIVE

## 2023-11-12 LAB — FSH/LH
FSH: 18.9 m[IU]/mL
LH: 9 m[IU]/mL

## 2023-11-12 LAB — ESTRADIOL: Estradiol: 87.3 pg/mL

## 2023-11-13 LAB — URINE CULTURE: Organism ID, Bacteria: NO GROWTH

## 2023-11-16 ENCOUNTER — Encounter: Payer: Self-pay | Admitting: Licensed Practical Nurse

## 2024-02-03 DIAGNOSIS — L814 Other melanin hyperpigmentation: Secondary | ICD-10-CM | POA: Diagnosis not present

## 2024-02-03 DIAGNOSIS — D229 Melanocytic nevi, unspecified: Secondary | ICD-10-CM | POA: Diagnosis not present

## 2024-02-03 DIAGNOSIS — Z08 Encounter for follow-up examination after completed treatment for malignant neoplasm: Secondary | ICD-10-CM | POA: Diagnosis not present

## 2024-02-03 DIAGNOSIS — L821 Other seborrheic keratosis: Secondary | ICD-10-CM | POA: Diagnosis not present

## 2024-02-09 ENCOUNTER — Ambulatory Visit (INDEPENDENT_AMBULATORY_CARE_PROVIDER_SITE_OTHER): Payer: BC Managed Care – PPO | Admitting: Licensed Practical Nurse

## 2024-02-09 ENCOUNTER — Encounter: Payer: Self-pay | Admitting: Licensed Practical Nurse

## 2024-02-09 VITALS — BP 116/74 | HR 66 | Ht 69.0 in | Wt 140.3 lb

## 2024-02-09 DIAGNOSIS — N951 Menopausal and female climacteric states: Secondary | ICD-10-CM | POA: Diagnosis not present

## 2024-02-09 DIAGNOSIS — Z30432 Encounter for removal of intrauterine contraceptive device: Secondary | ICD-10-CM

## 2024-02-09 DIAGNOSIS — Z1211 Encounter for screening for malignant neoplasm of colon: Secondary | ICD-10-CM

## 2024-02-09 DIAGNOSIS — Z30433 Encounter for removal and reinsertion of intrauterine contraceptive device: Secondary | ICD-10-CM

## 2024-02-09 DIAGNOSIS — Z800 Family history of malignant neoplasm of digestive organs: Secondary | ICD-10-CM

## 2024-02-09 DIAGNOSIS — Z30430 Encounter for insertion of intrauterine contraceptive device: Secondary | ICD-10-CM

## 2024-02-09 MED ORDER — LEVONORGESTREL 20 MCG/DAY IU IUD
1.0000 | INTRAUTERINE_SYSTEM | Freq: Once | INTRAUTERINE | Status: AC
  Administered 2024-02-09: 1 via INTRAUTERINE

## 2024-02-09 MED ORDER — ESTRADIOL 0.5 MG PO TABS: 0.5000 mg | ORAL_TABLET | Freq: Every day | ORAL | 3 refills | Status: DC

## 2024-02-09 NOTE — Progress Notes (Signed)
 Williams, Janna, PA-C   No chief complaint on file.   HPI:      Ruth Williams is a 43 y.o. [id number] whose LMP was No LMP recorded. (Menstrual status: IUD)., presents today for medication follow up.   Ruth Williams was seen in January in for an annual exam, she reported perimenopausal symptoms of hotflashes/sweats and changes in mood. At that time she was given LoLoestrin to trial. She did not take the LoLoestrin because her step mother passed away from an ER positive breast cancer, therefore she was nervous to try anything with estrogen. Her father passed away from bladder cancer (HER2 positive), she was told that she could be at risk of breast cancer because of her father's cancer. Her father also had colon cancer. She has not had a colonoscopy. Since that visit, Ruth Williams feels that her changes in mood are related to anxiety, she has struggled with Anxiety in the past. She continues to have sweats-where she will sweat profusely-she will end up in puddles which is embarrassing. She does get hot from her chest up "out of nowhere". She has also noticed brain fog. She has also had a different bleeding profile, 2 weeks ago she bled for 1 week, yesterday she had some tan like discharge, she did have IC over the weekend. She has had a Mirena  IUD for 6 years, for most of the time she has only experienced spotting. Ruth Williams wonders if she should continue with the Mirena  IUD and if she should start the OCP's.  She had an US  on 1/30 that showed a properly placed IUD, she notes the strings are not palpable or visible.    Patient Active Problem List   Diagnosis Date Noted   Basal cell carcinoma    Family history of colon cancer in father 12/28/2017   Rhegmatogenous retinal detachment of right eye 06/10/2016   Retinal break of left eye 06/10/2016   Right hip pain 05/21/2016   Visual disturbance of one eye 05/21/2016   Encounter for counseling 05/21/2016    Past Surgical History:  Procedure  Laterality Date   AIR/FLUID EXCHANGE Right 06/10/2016   Procedure: AIR/FLUID EXCHANGE;  Surgeon: Rexene Catching, MD;  Location: [facility] OR;  Service: Ophthalmology;  Laterality: Right;   AUGMENTATION MAMMAPLASTY     BREAST ENHANCEMENT SURGERY  07/13/2014   MOHS SURGERY  08/2017   basal cell carcinoma removal from nose   PHOTOCOAGULATION WITH LASER Bilateral 06/10/2016   Procedure: PHOTOCOAGULATION WITH LASER - HEADSCOPE LASER;  Surgeon: Rexene Catching, MD;  Location: [facility] OR;  Service: Ophthalmology;  Laterality: Bilateral;   RETINAL DETACHMENT SURGERY     right eye   SCLERAL BUCKLE Right 06/10/2016   Procedure: SCLERAL BUCKLE;  Surgeon: Rexene Catching, MD;  Location: [facility] OR;  Service: Ophthalmology;  Laterality: Right;    Family History  Problem Relation Age of Onset   Mental illness Mother    Hypertension Mother    COPD Mother        smoker died age 67   Colon cancer Father 30   Bladder Cancer Father 74       bladder to colon her2+ died age 41    Diabetes Maternal Grandmother    Throat cancer Paternal Grandfather    Pancreatic cancer Paternal Grandfather    Stomach cancer Maternal Aunt 30   Celiac disease Half-Sister     Social History   Socioeconomic History   Marital status: Married    Spouse name: Not on  file   Number of children: 2   Years of education: Not on file   Highest education level: Not on file  Occupational History   Occupation: Homemaker  Tobacco Use   Smoking status: Former    Current packs/day: 0.00    Types: Cigarettes    Quit date: 05/09/1999    Years since quitting: 24.7   Smokeless tobacco: Never  Vaping Use   Vaping status: Never Used  Substance and Sexual Activity   Alcohol use: Yes    Comment: occasional   Drug use: No   Sexual activity: Yes    Partners: Female    Birth control/protection: I.U.D.    Comment: Patient has slight spotting with IUD  Other Topics Concern   Not on file  Social History Narrative   1 daughter 39 y.o, 1 son 58 y.o  as of 04/08/19   Husband Amayiah Carbary [phone] DPR   Social Drivers of Health   Financial Resource Strain: Not on file  Food Insecurity: Not on file  Transportation Needs: Not on file  Physical Activity: Sufficiently Active (04/08/2019)   Exercise Vital Sign    Days of Exercise per Week: 4 days    Minutes of Exercise per Session: 60 min  Stress: No Stress Concern Present (04/08/2019)   [employer] of Occupational Health - Occupational Stress Questionnaire    Feeling of Stress : Only a little  Social Connections: Unknown (06/30/2023)   Received from [facility]   Social Network    Social Network: Not on file  Intimate Partner Violence: Unknown (06/30/2023)   Received from [facility]   HITS    Physically Hurt: Not on file    Insult or Talk Down To: Not on file    Threaten Physical Harm: Not on file    Scream or Curse: Not on file    Outpatient Medications Prior to Visit  Medication Sig Dispense Refill   Ascorbic Acid (VITAMIN C) 1000 MG tablet Take 1,000 mg by mouth daily.     Biotin 100 MG/GM POWD Take by mouth.     cholecalciferol (VITAMIN D3) 25 MCG (1000 UNIT) tablet Take 1,000 Units by mouth daily.     levonorgestrel  (MIRENA ) 20 MCG/24HR IUD 1 each by Intrauterine route once. Implanted March 2014     Norethindrone-Ethinyl Estradiol -Fe Biphas (LO LOESTRIN FE ) 1 MG-10 MCG / 10 MCG tablet Take 1 tablet by mouth daily. (Patient not taking: Reported on 02/09/2024)     No facility-administered medications prior to visit.      ROS:  Review of Systems see HPI    OBJECTIVE:   Vitals:  BP 116/74 (BP Location: Right Arm, Patient Position: Sitting, Cuff Size: Normal)   Pulse 66   Ht 5\' 9"  (1.753 m)   Wt 140 lb 4.8 oz (63.6 kg)   BMI 20.72 kg/m   Physical Exam Constitutional:      Appearance: Normal appearance.  Cardiovascular:     Rate and Rhythm: Normal rate.  Pulmonary:     Effort: Pulmonary effort is normal.  Abdominal:     General: Abdomen is  flat.     Tenderness: There is no abdominal tenderness.  Genitourinary:    General: Normal vulva.  Musculoskeletal:     Cervical back: Normal range of motion.  Neurological:     Mental Status: She is alert.  Psychiatric:        Mood and Affect: Mood normal.       GYNECOLOGY OFFICE PROCEDURE NOTE  KAMALPREET SEMAN is a 43 y.o. [id number] here for IUD removal and reinsertion. The patient currently has a Mirena   IUD placed 6 years ago which will be replaced with a Mirena  IUD today.  No GYN concerns.  Last pap smear was on 2023 and was normal.  IUD Removal and Reinsertion  Patient identified, informed consent performed, consent signed.   Discussed risks of irregular bleeding, cramping, infection, malpositioning or uterine perforation of the IUD which may require further procedures. Time out was performed. Speculum placed in the vagina. The strings of the were not visible, the IUD was grasped with mosquito clamps and  The IUD was successfully removed in its entirety. The cervix was cleaned with Betadine x 2 and grasped anteriorly with a single tooth tenaculum.  The uterus was sounded  to 8.75 cm using a uterine sound.  The IUD was then placed per manufacturer's recommendations. Strings trimmed to 3 cm. Tenaculum was removed, good hemostasis noted. Patient tolerated procedure well.    Anice Kerbs, CNM  [facility]   Results: No results found for this or any previous visit (from the past 24 hours).   Assessment/Plan: Perimenopause - Plan: estradiol  (ESTRACE ) 0.5 MG tablet  Encounter for insertion of Mirena  IUD - Plan: levonorgestrel  (MIRENA ) 20 MCG/DAY IUD 1 each  Screening for colon cancer - Plan: Ambulatory referral to Gastroenterology  Family history of colon cancer - Plan: Ambulatory referral to Gastroenterology  Encounter for IUD removal  -Discussed risks/benefits/side effects  of estrogen replacement for perimenopausal symptoms, could use OCP's or estrogen  alone. She could also not use any estrogen. Annaliyah opted for estrogen alone as she has a progesterone  IUD.  -RTC in 3 months for medication follow up and IUD string check.   Meds ordered this encounter  Medications   estradiol  (ESTRACE ) 0.5 MG tablet    Sig: Take 1 tablet (0.5 mg total) by mouth daily.    Dispense:  30 tablet    Refill:  3   levonorgestrel  (MIRENA ) 20 MCG/DAY IUD 1 each     Berkley Breech [facility], CNM 02/09/2024 3:31 PM

## 2024-02-23 ENCOUNTER — Encounter: Payer: Self-pay | Admitting: Physician Assistant

## 2024-02-24 ENCOUNTER — Telehealth: Payer: Self-pay

## 2024-02-24 DIAGNOSIS — J301 Allergic rhinitis due to pollen: Secondary | ICD-10-CM | POA: Diagnosis not present

## 2024-02-24 DIAGNOSIS — H6123 Impacted cerumen, bilateral: Secondary | ICD-10-CM | POA: Diagnosis not present

## 2024-02-24 NOTE — Telephone Encounter (Signed)
 Pt requesting call back to schedule colonoscopy.

## 2024-02-24 NOTE — Telephone Encounter (Signed)
 Message left for patient to return my call.

## 2024-02-25 ENCOUNTER — Telehealth: Payer: Self-pay | Admitting: *Deleted

## 2024-02-25 ENCOUNTER — Other Ambulatory Visit: Payer: Self-pay | Admitting: *Deleted

## 2024-02-25 DIAGNOSIS — Z1211 Encounter for screening for malignant neoplasm of colon: Secondary | ICD-10-CM

## 2024-02-25 DIAGNOSIS — Z800 Family history of malignant neoplasm of digestive organs: Secondary | ICD-10-CM

## 2024-02-25 MED ORDER — NA SULFATE-K SULFATE-MG SULF 17.5-3.13-1.6 GM/177ML PO SOLN: 1.0000 | Freq: Once | ORAL | 0 refills | Status: AC

## 2024-02-25 NOTE — Telephone Encounter (Signed)
 Gastroenterology Pre-Procedure Review  Request Date: 04/11/2024 Requesting Physician: Dr. Ole Berkeley  PATIENT REVIEW QUESTIONS: The patient responded to the following health history questions as indicated:    1. Are you having any GI issues? no 2. Do you have a personal history of Polyps? no 3. Do you have a family history of Colon Cancer or Polyps? yes (father had colon cancer) 4. Diabetes Mellitus? no 5. Joint replacements in the past 12 months?no 6. Major health problems in the past 3 months?no 7. Any artificial heart valves, MVP, or defibrillator?no    MEDICATIONS & ALLERGIES:    Patient reports the following regarding taking any anticoagulation/antiplatelet therapy:   Plavix, Coumadin, Eliquis, Xarelto, Lovenox, Pradaxa, Brilinta, or Effient? no Aspirin? no  Patient confirms/reports the following medications:  Current Outpatient Medications  Medication Sig Dispense Refill   Ascorbic Acid (VITAMIN C) 1000 MG tablet Take 1,000 mg by mouth daily.     Biotin 100 MG/GM POWD Take by mouth.     cholecalciferol (VITAMIN D3) 25 MCG (1000 UNIT) tablet Take 1,000 Units by mouth daily.     estradiol  (ESTRACE ) 0.5 MG tablet Take 1 tablet (0.5 mg total) by mouth daily. 30 tablet 3   levonorgestrel  (MIRENA ) 20 MCG/24HR IUD 1 each by Intrauterine route once. Implanted March 2014     No current facility-administered medications for this visit.    Patient confirms/reports the following allergies:  Allergies  Allergen Reactions   No Known Allergies     No orders of the defined types were placed in this encounter.   AUTHORIZATION INFORMATION Primary Insurance: 1D#: Group #:  Secondary Insurance: 1D#: Group #:  SCHEDULE INFORMATION: Date: 04/11/2024 Time: Location:[facility]

## 2024-02-25 NOTE — Telephone Encounter (Signed)
 Marnee Sink, MD to [facility], CMA  02/25/24 10:39 AM  Note  Yes   02/25/24 10:13 AM Kellie Murrill, CMA  routed this conversation to Marnee Sink, MD   Delma Fern, [facility]  02/25/24 10:08 AM  Note  Is it okay to schedule colonoscopy? Patient's father was diagnosis with colon cancer at 41 years old and other family members had colon polyps. Patient is 43 years old.    If you possible request to schedule on 04/11/2024.

## 2024-02-25 NOTE — Telephone Encounter (Addendum)
 Is it okay to schedule colonoscopy? Patient's father was diagnosis with colon cancer at 41 years old and other family members had colon polyps. Patient is 43 years old.   If you possible request to schedule on 04/11/2024.

## 2024-02-25 NOTE — Telephone Encounter (Signed)
 Message left for patient to return my call.

## 2024-03-03 ENCOUNTER — Other Ambulatory Visit: Payer: Self-pay | Admitting: Licensed Practical Nurse

## 2024-03-03 DIAGNOSIS — N951 Menopausal and female climacteric states: Secondary | ICD-10-CM

## 2024-04-07 ENCOUNTER — Ambulatory Visit (INDEPENDENT_AMBULATORY_CARE_PROVIDER_SITE_OTHER): Admitting: Physician Assistant

## 2024-04-07 ENCOUNTER — Encounter: Payer: Self-pay | Admitting: Physician Assistant

## 2024-04-07 VITALS — BP 102/67 | HR 74 | Resp 16 | Ht 69.0 in | Wt 143.0 lb

## 2024-04-07 DIAGNOSIS — Z0001 Encounter for general adult medical examination with abnormal findings: Secondary | ICD-10-CM | POA: Diagnosis not present

## 2024-04-07 DIAGNOSIS — R5383 Other fatigue: Secondary | ICD-10-CM | POA: Diagnosis not present

## 2024-04-07 DIAGNOSIS — G4709 Other insomnia: Secondary | ICD-10-CM | POA: Diagnosis not present

## 2024-04-07 DIAGNOSIS — Z0000 Encounter for general adult medical examination without abnormal findings: Secondary | ICD-10-CM

## 2024-04-07 DIAGNOSIS — R413 Other amnesia: Secondary | ICD-10-CM

## 2024-04-07 DIAGNOSIS — N951 Menopausal and female climacteric states: Secondary | ICD-10-CM

## 2024-04-07 DIAGNOSIS — E049 Nontoxic goiter, unspecified: Secondary | ICD-10-CM | POA: Diagnosis not present

## 2024-04-07 DIAGNOSIS — F4322 Adjustment disorder with anxiety: Secondary | ICD-10-CM

## 2024-04-07 MED ORDER — HYDROXYZINE HCL 10 MG PO TABS: 10.0000 mg | ORAL_TABLET | Freq: Three times a day (TID) | ORAL | 0 refills | Status: AC | PRN

## 2024-04-07 NOTE — Progress Notes (Signed)
 Complete physical exam  Patient: Ruth Williams   DOB: 1981-01-11   43 y.o. Female  MRN: [id number] Visit Date: 04/07/2024  Today's healthcare provider: [name], PA-C   Chief Complaint  Patient presents with   Annual Exam    CPE wants to discuss sleep meds    Subjective    Ruth Williams is a 43 y.o. female who presents today for a complete physical exam.   Discussed the use of AI scribe software for clinical note transcription with the patient, who gave verbal consent to proceed.  History of Present Illness Ruth Williams is a 43 year old female who presents with perimenopausal symptoms and anxiety.  She experiences night sweats and hot flashes, which began after starting estradiol . She discontinued estradiol  due to nausea and concerns about a family history of estrogen-positive cancer. She believes her IUD may have contributed to her hot flashes, which have since improved.  She experiences anxiety, particularly related to her children and social situations, especially when her children are in school. Her son has a learning disability, which contributes to her anxiety. She engages in regular physical activity, including gym workouts and daily walks, to manage her anxiety.  She experiences sleep disturbances, waking up three times a night, and attributes this to being a light sleeper. She denies snoring or extreme daytime tiredness but feels more tired than others in the evening. She experiences headaches about once a month, often related to allergies.  She has irregular menstrual periods, managed with birth control since ninth grade, except during pregnancies. She started her period later than usual, around ninth grade.  She reports occasional stuttering, noticed recently, but denies any recent falls or injuries. She consumes alcohol occasionally and denies smoking or recreational drug use. Her father passed away from colon cancer at age 60.       04/07/2024    9:25 AM 11/11/2023    9:22 AM 02/20/2023   11:22 AM  GAD 7 : Generalized Anxiety Score  Nervous, Anxious, on Edge 1 2 1   Control/stop worrying 0 2 0  Worry too much - different things 0 2 0  Trouble relaxing 0 0 0  Restless 0 0 0  Easily annoyed or irritable 0 2 1  Afraid - awful might happen 0 1 0  Total GAD 7 Score 1 9 2   Anxiety Difficulty Not difficult at all Somewhat difficult      Last depression screening scores    04/07/2024    9:24 AM 11/11/2023    9:22 AM 02/20/2023   10:31 AM  PHQ 2/9 Scores  PHQ - 2 Score 0 0 0  PHQ- 9 Score 1 0 0   Last fall risk screening    02/20/2023   10:23 AM  Fall Risk   Falls in the past year? 0  Number falls in past yr: 0  Injury with Fall? 0   Last Audit-C alcohol use screening    02/20/2023   10:23 AM  Alcohol Use Disorder Test (AUDIT)  1. How often do you have a drink containing alcohol? 2  2. How many drinks containing alcohol do you have on a typical day when you are drinking? 0  3. How often do you have six or more drinks on one occasion? 0  AUDIT-C Score 2   A score of 3 or more in women, and 4 or more in men indicates increased risk for alcohol abuse, EXCEPT if all of the points are  from question 1   Past Medical History:  Diagnosis Date   Allergy    seasonal   Basal cell carcinoma 07/30/2017   R nasal dorsum - MOHS   Bleeding from the nose    BRCA negative 11/2019   MyRisk neg; IBIS=13%; riskscore=16.6%   Chicken pox    Complication of anesthesia    Reports that time she was put to sleep reports that she woke up crazy   Detached retina 05/2016   right   Encounter for colonoscopy in patient with family history of colon cancer    1/21 cancer genetic testing letter sent   Family history of pancreatic cancer    cancer genetic testing neg   Migraines    SAB (spontaneous abortion) 07/13/2009   Shingles    x2   Past Surgical History:  Procedure Laterality Date   AIR/FLUID EXCHANGE Right 06/10/2016    Procedure: AIR/FLUID EXCHANGE;  Surgeon: Norleen JONETTA Ku, MD;  Location: [facility] OR;  Service: Ophthalmology;  Laterality: Right;   AUGMENTATION MAMMAPLASTY     BREAST ENHANCEMENT SURGERY  07/13/2014   MOHS SURGERY  08/2017   basal cell carcinoma removal from nose   PHOTOCOAGULATION WITH LASER Bilateral 06/10/2016   Procedure: PHOTOCOAGULATION WITH LASER - HEADSCOPE LASER;  Surgeon: Norleen JONETTA Ku, MD;  Location: [facility] OR;  Service: Ophthalmology;  Laterality: Bilateral;   RETINAL DETACHMENT SURGERY     right eye   SCLERAL BUCKLE Right 06/10/2016   Procedure: SCLERAL BUCKLE;  Surgeon: Norleen JONETTA Ku, MD;  Location: [facility] OR;  Service: Ophthalmology;  Laterality: Right;   Social History   Socioeconomic History   Marital status: Married    Spouse name: Not on file   Number of children: 2   Years of education: Not on file   Highest education level: Not on file  Occupational History   Occupation: Homemaker  Tobacco Use   Smoking status: Former    Current packs/day: 0.00    Types: Cigarettes    Quit date: 05/09/1999    Years since quitting: 24.9   Smokeless tobacco: Never  Vaping Use   Vaping status: Never Used  Substance and Sexual Activity   Alcohol use: Yes    Comment: occasional   Drug use: No   Sexual activity: Yes    Partners: Female    Birth control/protection: I.U.D.    Comment: Patient has slight spotting with IUD  Other Topics Concern   Not on file  Social History Narrative   1 daughter 35 y.o, 1 son 43 y.o as of 04/08/19   Husband Chasey Dull [phone] DPR   Social Drivers of Health   Financial Resource Strain: Not on file  Food Insecurity: Not on file  Transportation Needs: Not on file  Physical Activity: Sufficiently Active (04/08/2019)   Exercise Vital Sign    Days of Exercise per Week: 4 days    Minutes of Exercise per Session: 60 min  Stress: No Stress Concern Present (04/08/2019)   [employer] of Occupational Health - Occupational Stress  Questionnaire    Feeling of Stress : Only a little  Social Connections: Unknown (06/30/2023)   Received from [facility]   Social Network    Social Network: Not on file  Intimate Partner Violence: Unknown (06/30/2023)   Received from [facility]   HITS    Physically Hurt: Not on file    Insult or Talk Down To: Not on file    Threaten Physical Harm: Not on file  Scream or Curse: Not on file   Family Status  Relation Name Status   Mother  Deceased   Father  Deceased at age 90   MGM  Deceased   PGF  (Not Specified)   Mat Aunt  Deceased   Sister  Alive   H Sister  Alive  No partnership data on file   Family History  Problem Relation Age of Onset   Mental illness Mother    Hypertension Mother    COPD Mother        smoker died age 70   Colon cancer Father 31   Bladder Cancer Father 59       bladder to colon her2+ died age 65    Diabetes Maternal Grandmother    Throat cancer Paternal Grandfather    Pancreatic cancer Paternal Grandfather    Stomach cancer Maternal Aunt 30   Celiac disease Half-Sister    Allergies  Allergen Reactions   No Known Allergies     Patient Care Team: Trenita Hulme, PA-C as PCP - General (Physician Assistant)   Medications: Outpatient Medications Prior to Visit  Medication Sig   Ascorbic Acid (VITAMIN C) 1000 MG tablet Take 1,000 mg by mouth daily. (Patient not taking: Reported on 04/07/2024)   Biotin 100 MG/GM POWD Take by mouth. (Patient not taking: Reported on 04/07/2024)   cholecalciferol (VITAMIN D3) 25 MCG (1000 UNIT) tablet Take 1,000 Units by mouth daily. (Patient not taking: Reported on 04/07/2024)   estradiol  (ESTRACE ) 0.5 MG tablet TAKE 1 TABLET BY MOUTH EVERY DAY (Patient not taking: Reported on 04/07/2024)   levonorgestrel  (MIRENA ) 20 MCG/24HR IUD 1 each by Intrauterine route once. Implanted March 2014 (Patient not taking: Reported on 04/07/2024)   No facility-administered medications prior to visit.    Review of Systems  All  other systems reviewed and are negative.  Except see HPI     Objective    BP 102/67 (BP Location: Right Arm, Patient Position: Sitting)   Pulse 74   Resp 16   Ht 5' 9 (1.753 m)   Wt 143 lb (64.9 kg)   SpO2 99%   BMI 21.12 kg/m      Physical Exam Vitals reviewed.  Constitutional:      General: She is not in acute distress.    Appearance: Normal appearance. She is well-developed. She is not ill-appearing, toxic-appearing or diaphoretic.  HENT:     Head: Normocephalic and atraumatic.     Right Ear: Tympanic membrane, ear canal and external ear normal.     Left Ear: Tympanic membrane, ear canal and external ear normal.     Nose: Nose normal. No congestion or rhinorrhea.     Mouth/Throat:     Mouth: Mucous membranes are moist.     Pharynx: Oropharynx is clear. No oropharyngeal exudate.   Eyes:     General: No scleral icterus.       Right eye: No discharge.        Left eye: No discharge.     Conjunctiva/sclera: Conjunctivae normal.     Pupils: Pupils are equal, round, and reactive to light.   Neck:     Thyroid: No thyromegaly.     Vascular: No carotid bruit.   Cardiovascular:     Rate and Rhythm: Normal rate and regular rhythm.     Pulses: Normal pulses.     Heart sounds: Normal heart sounds. No murmur heard.    No friction rub. No gallop.  Pulmonary:  Effort: Pulmonary effort is normal. No respiratory distress.     Breath sounds: Normal breath sounds. No wheezing or rales.  Abdominal:     General: Abdomen is flat. Bowel sounds are normal. There is no distension.     Palpations: Abdomen is soft. There is no mass.     Tenderness: There is no abdominal tenderness. There is no right CVA tenderness, left CVA tenderness, guarding or rebound.     Hernia: No hernia is present.   Musculoskeletal:        General: No swelling, tenderness, deformity or signs of injury. Normal range of motion.     Cervical back: Normal range of motion and neck supple. No rigidity or  tenderness.     Right lower leg: No edema.     Left lower leg: No edema.  Lymphadenopathy:     Cervical: No cervical adenopathy.   Skin:    General: Skin is warm and dry.     Coloration: Skin is not jaundiced or pale.     Findings: No bruising, erythema, lesion or rash.   Neurological:     Mental Status: She is alert and oriented to person, place, and time. Mental status is at baseline.     Gait: Gait normal.   Psychiatric:        Mood and Affect: Mood normal.        Behavior: Behavior normal.        Thought Content: Thought content normal.        Judgment: Judgment normal.   Pelvic and breast exams deferred  No results found for any visits on 04/07/24.  Assessment & Plan    Routine Health Maintenance and Physical Exam  Exercise Activities and Dietary recommendations  Goals   None     Immunization History  Administered Date(s) Administered   Influenza Inj Mdck Quad Pf 07/28/2017   Influenza,inj,Quad PF,6+ Mos 08/23/2015, 08/04/2018   Influenza-Unspecified 08/23/2015, 07/26/2019   Td 10/14/2007, 02/14/2022   Tdap 11/12/2011    Health Maintenance  Topic Date Due   COVID-19 Vaccine (1) 04/23/2024*   Hepatitis B Vaccine (1 of 3 - 19+ 3-dose series) 10/07/2024*   HPV Vaccine (1 - Risk 3-dose SCDM series) 04/07/2025*   Flu Shot  05/13/2024   Pap with HPV screening  11/04/2026   DTaP/Tdap/Td vaccine (4 - Td or Tdap) 02/15/2032   Hepatitis C Screening  Completed   HIV Screening  Completed   Meningitis B Vaccine  Aged Out  *Topic was postponed. The date shown is not the original due date.    Discussed health benefits of physical activity, and encouraged her to engage in regular exercise appropriate for her age and condition. Assessment & Plan Perimenopausal symptoms Symptoms improved after discontinuing estradiol  due to cancer risk concerns. Discussed alternative treatments including anxiety and depression medications. - Discontinue estradiol . - Consider anxiety  and depression medications for symptom management.  Anxiety Anxiety reduced during summer break. Discussed therapy, exercise, and potential use of antihistamines for anxiety and sleep during mission trip. - Consider therapy sessions or online therapy. - Continue regular exercise; consider yoga or meditation. - Discussed first-generation antihistamines for anxiety and sleep during mission trip.  Sleep disturbances Frequent awakenings possibly related to anxiety and perimenopausal symptoms. Discussed antihistamines for sleep during mission trip. - Prescribe first-generation antihistamines for sleep and allergies during mission trip. - Start with half dose, adjust as needed, up to three times nightly.  Thyroid dysfunction Symptoms suggest possible thyroid dysfunction despite normal previous  blood work. Further evaluation warranted. - Order TSH test. - Consider thyroid ultrasound if TSH indicates.  Memory concerns Occasional stuttering and memory issues possibly related to hormonal changes or anxiety Family history of Alzheimer's noted. - Schedule follow-up in one month for memory assessment and testing.  General Health Maintenance Up to date with dental and ophthalmology visits. Colonoscopy scheduled due to family history of colon cancer. - Ensure proper preparation for colonoscopy. - Schedule annual physical in one year.  Other fatigue Chronic Workup ordered - CBC with Differential/Platelet - Comprehensive metabolic panel with GFR - Hemoglobin A1c - Lipid panel - TSH Reassess after receiving lab results  Other insomnia  - hydrOXYzine  (ATARAX ) 10 MG tablet; Take 1 tablet (10 mg total) by mouth 3 (three) times daily as needed.  Dispense: 30 tablet; Refill: 0  Goiter  - US  THYROID; Future  Annual physical exam (Primary) Well adult visit with abnormal findings Things to do to keep yourself healthy  - Exercise at least 30-45 minutes a day, 3-4 days a week.  - Eat a low-fat  diet with lots of fruits and vegetables, up to 7-9 servings per day.  - Seatbelts can save your life. Wear them always.  - Smoke detectors on every level of your home, check batteries every year.  - Eye Doctor - have an eye exam every 1-2 years  - Safe sex - if you may be exposed to STDs, use a condom.  - Alcohol -  If you drink, do it moderately, less than 2 drinks per day.  - Health Care Power of Attorney. Choose someone to speak for you if you are not able.  - Depression is common in our stressful world.If you're feeling down or losing interest in things you normally enjoy, please come in for a visit.  - Violence - If anyone is threatening or hurting you, please call immediately.    Return for CPE in a year, memory problems in 1-2 mo.    The patient was advised to call back or seek an in-person evaluation if the symptoms worsen or if the condition fails to improve as anticipated.  I discussed the assessment and treatment plan with the patient. The patient was provided an opportunity to ask questions and all were answered. The patient agreed with the plan and demonstrated an understanding of the instructions.  I, Larhonda Dettloff, PA-C have reviewed all documentation for this visit. The documentation on 04/07/2024  for the exam, diagnosis, procedures, and orders are all accurate and complete.  [name], [facility], [facility] [phone] (phone) [phone] (fax)  [facility]

## 2024-04-08 ENCOUNTER — Ambulatory Visit
Admission: RE | Admit: 2024-04-08 | Discharge: 2024-04-08 | Disposition: A | Discharge status: Home / Self Care | Source: Ambulatory Visit | Attending: Physician Assistant | Admitting: Physician Assistant

## 2024-04-08 DIAGNOSIS — R5383 Other fatigue: Secondary | ICD-10-CM | POA: Insufficient documentation

## 2024-04-08 DIAGNOSIS — Z0000 Encounter for general adult medical examination without abnormal findings: Secondary | ICD-10-CM | POA: Insufficient documentation

## 2024-04-08 DIAGNOSIS — R413 Other amnesia: Secondary | ICD-10-CM | POA: Insufficient documentation

## 2024-04-08 DIAGNOSIS — E049 Nontoxic goiter, unspecified: Secondary | ICD-10-CM | POA: Insufficient documentation

## 2024-04-08 DIAGNOSIS — G4709 Other insomnia: Secondary | ICD-10-CM | POA: Insufficient documentation

## 2024-04-08 DIAGNOSIS — N951 Menopausal and female climacteric states: Secondary | ICD-10-CM | POA: Insufficient documentation

## 2024-04-08 DIAGNOSIS — E041 Nontoxic single thyroid nodule: Secondary | ICD-10-CM | POA: Diagnosis not present

## 2024-04-08 DIAGNOSIS — F4322 Adjustment disorder with anxiety: Secondary | ICD-10-CM | POA: Insufficient documentation

## 2024-04-09 LAB — CBC WITH DIFFERENTIAL/PLATELET
Basophils Absolute: 0 10*3/uL (ref 0.0–0.2)
Basos: 1 %
EOS (ABSOLUTE): 0.1 10*3/uL (ref 0.0–0.4)
Eos: 2 %
Hematocrit: 42.2 % (ref 34.0–46.6)
Hemoglobin: 13.7 g/dL (ref 11.1–15.9)
Immature Grans (Abs): 0 10*3/uL (ref 0.0–0.1)
Immature Granulocytes: 0 %
Lymphocytes Absolute: 1.2 10*3/uL (ref 0.7–3.1)
Lymphs: 28 %
MCH: 30.2 pg (ref 26.6–33.0)
MCHC: 32.5 g/dL (ref 31.5–35.7)
MCV: 93 fL (ref 79–97)
Monocytes Absolute: 0.5 10*3/uL (ref 0.1–0.9)
Monocytes: 11 %
Neutrophils Absolute: 2.4 10*3/uL (ref 1.4–7.0)
Neutrophils: 57 %
Platelets: 229 10*3/uL (ref 150–450)
RBC: 4.54 x10E6/uL (ref 3.77–5.28)
RDW: 12.6 % (ref 11.7–15.4)
WBC: 4.1 10*3/uL (ref 3.4–10.8)

## 2024-04-09 LAB — COMPREHENSIVE METABOLIC PANEL WITH GFR
ALT: 25 IU/L (ref 0–32)
AST: 24 IU/L (ref 0–40)
Albumin: 4.4 g/dL (ref 3.9–4.9)
Alkaline Phosphatase: 81 IU/L (ref 44–121)
BUN/Creatinine Ratio: 15 (ref 9–23)
BUN: 12 mg/dL (ref 6–24)
Bilirubin Total: 0.5 mg/dL (ref 0.0–1.2)
CO2: 23 mmol/L (ref 20–29)
Calcium: 9.2 mg/dL (ref 8.7–10.2)
Chloride: 102 mmol/L (ref 96–106)
Creatinine, Ser: 0.79 mg/dL (ref 0.57–1.00)
Globulin, Total: 2.1 g/dL (ref 1.5–4.5)
Glucose: 86 mg/dL (ref 70–99)
Potassium: 4.3 mmol/L (ref 3.5–5.2)
Sodium: 139 mmol/L (ref 134–144)
Total Protein: 6.5 g/dL (ref 6.0–8.5)
eGFR: 96 mL/min/{1.73_m2} (ref >59)

## 2024-04-09 LAB — LIPID PANEL
Chol/HDL Ratio: 2.2 ratio (ref 0.0–4.4)
Cholesterol, Total: 170 mg/dL (ref 100–199)
HDL: 78 mg/dL (ref >39)
LDL Chol Calc (NIH): 83 mg/dL (ref 0–99)
Triglycerides: 42 mg/dL (ref 0–149)
VLDL Cholesterol Cal: 9 mg/dL (ref 5–40)

## 2024-04-09 LAB — HEMOGLOBIN A1C
Est. average glucose Bld gHb Est-mCnc: 103 mg/dL
Hgb A1c MFr Bld: 5.2 % (ref 4.8–5.6)

## 2024-04-09 LAB — TSH: TSH: 0.809 u[IU]/mL (ref 0.450–4.500)

## 2024-04-09 NOTE — Anesthesia Preprocedure Evaluation (Signed)
 Anesthesia Evaluation  Patient identified by MRN, date of birth, ID band Patient awake    Reviewed: Allergy & Precautions, NPO status , Patient's Chart, lab work & pertinent test results  History of Anesthesia Complications (+) Emergence Delirium and history of anesthetic complications  Airway Mallampati: III   Neck ROM: Full    Dental no notable dental hx.    Pulmonary former smoker (quit 2000)   Pulmonary exam normal breath sounds clear to auscultation       Cardiovascular Exercise Tolerance: Good negative cardio ROS Normal cardiovascular exam Rhythm:Regular Rate:Normal     Neuro/Psych  Headaches    GI/Hepatic negative GI ROS,,,  Endo/Other  negative endocrine ROS    Renal/GU negative Renal ROS     Musculoskeletal   Abdominal   Peds  Hematology negative hematology ROS (+)   Anesthesia Other Findings   Reproductive/Obstetrics                             Anesthesia Physical Anesthesia Plan  ASA: 2  Anesthesia Plan: General   Post-op Pain Management:    Induction: Intravenous  PONV Risk Score and Plan: 3 and Propofol  infusion, TIVA and Treatment may vary due to age or medical condition  Airway Management Planned: Natural Airway  Additional Equipment:   Intra-op Plan:   Post-operative Plan:   Informed Consent: I have reviewed the patients History and Physical, chart, labs and discussed the procedure including the risks, benefits and alternatives for the proposed anesthesia with the patient or authorized representative who has indicated his/her understanding and acceptance.       Plan Discussed with: CRNA  Anesthesia Plan Comments: (LMA/GETA backup discussed.  Patient consented for risks of anesthesia including but not limited to:  - adverse reactions to medications - damage to eyes, teeth, lips or other oral mucosa - nerve damage due to positioning  - sore throat or  hoarseness - damage to heart, brain, nerves, lungs, other parts of body or loss of life  Informed patient about role of CRNA in peri- and intra-operative care.  Patient voiced understanding.)       Anesthesia Quick Evaluation

## 2024-04-11 ENCOUNTER — Encounter: Admission: RE | Payer: Self-pay | Source: Home / Self Care

## 2024-04-11 ENCOUNTER — Ambulatory Visit: Payer: Self-pay | Admitting: Anesthesiology

## 2024-04-11 ENCOUNTER — Encounter: Payer: Self-pay | Admitting: Anesthesiology

## 2024-04-11 ENCOUNTER — Encounter: Payer: Self-pay | Admitting: Gastroenterology

## 2024-04-11 ENCOUNTER — Ambulatory Visit: Payer: Self-pay | Admitting: Physician Assistant

## 2024-04-11 ENCOUNTER — Ambulatory Visit
Admission: RE | Admit: 2024-04-11 | Discharge: 2024-04-11 | Disposition: A | Discharge status: Home / Self Care | Attending: Gastroenterology | Admitting: Gastroenterology

## 2024-04-11 DIAGNOSIS — Z1211 Encounter for screening for malignant neoplasm of colon: Secondary | ICD-10-CM | POA: Diagnosis not present

## 2024-04-11 DIAGNOSIS — K640 First degree hemorrhoids: Secondary | ICD-10-CM | POA: Diagnosis not present

## 2024-04-11 DIAGNOSIS — Z87891 Personal history of nicotine dependence: Secondary | ICD-10-CM | POA: Diagnosis not present

## 2024-04-11 DIAGNOSIS — Z800 Family history of malignant neoplasm of digestive organs: Secondary | ICD-10-CM | POA: Insufficient documentation

## 2024-04-11 HISTORY — PX: COLONOSCOPY: SHX5424

## 2024-04-11 SURGERY — COLONOSCOPY
Anesthesia: General

## 2024-04-11 MED ORDER — STERILE WATER FOR IRRIGATION IR SOLN
Status: DC | PRN
  Administered 2024-04-11: 60 mL

## 2024-04-11 MED ORDER — PROPOFOL 10 MG/ML IV BOLUS
INTRAVENOUS | Status: DC | PRN
  Administered 2024-04-11 (×4): 50 mg via INTRAVENOUS
  Administered 2024-04-11: 120 mg via INTRAVENOUS
  Administered 2024-04-11: 50 mg via INTRAVENOUS
  Administered 2024-04-11: 30 mg via INTRAVENOUS
  Administered 2024-04-11: 50 mg via INTRAVENOUS

## 2024-04-11 MED ORDER — SODIUM CHLORIDE 0.9 % IV SOLN
INTRAVENOUS | Status: DC
  Administered 2024-04-11: 500 mL via INTRAVENOUS

## 2024-04-11 NOTE — H&P (Signed)
 Rogelia Copping, MD [facility] [address], [address] [place], [place] [address] Phone:[phone] Fax : [phone]  Primary Care Physician:  Ostwalt, Janna, PA-C Primary Gastroenterologist:  Dr. Copping  Pre-Procedure History & Physical: HPI:  Ruth Williams is a 43 y.o. female is here for an colonoscopy.   Past Medical History:  Diagnosis Date   Allergy    seasonal   Basal cell carcinoma 07/30/2017   R nasal dorsum - MOHS   Bleeding from the nose    BRCA negative 11/2019   MyRisk neg; IBIS=13%; riskscore=16.6%   Chicken pox    Complication of anesthesia    Reports that time she was put to sleep reports that she woke up crazy   Detached retina 05/2016   right   Encounter for colonoscopy in patient with family history of colon cancer    1/21 cancer genetic testing letter sent   Family history of pancreatic cancer    cancer genetic testing neg   Migraines    SAB (spontaneous abortion) 07/13/2009   Shingles    x2    Past Surgical History:  Procedure Laterality Date   AIR/FLUID EXCHANGE Right 06/10/2016   Procedure: AIR/FLUID EXCHANGE;  Surgeon: Norleen JONETTA Ku, MD;  Location: [facility] OR;  Service: Ophthalmology;  Laterality: Right;   AUGMENTATION MAMMAPLASTY     BREAST ENHANCEMENT SURGERY  07/13/2014   MOHS SURGERY  08/2017   basal cell carcinoma removal from nose   PHOTOCOAGULATION WITH LASER Bilateral 06/10/2016   Procedure: PHOTOCOAGULATION WITH LASER - HEADSCOPE LASER;  Surgeon: Norleen JONETTA Ku, MD;  Location[facility] OR;  Service: Ophthalmology;  Laterality: Bilateral;   RETINAL DETACHMENT SURGERY     right eye   SCLERAL BUCKLE Right 06/10/2016   Procedure: SCLERAL BUCKLE;  Surgeon: Norleen JONETTA Ku, MD;  Location: [facility] OR;  Service: Ophthalmology;  Laterality: Right;    Prior to Admission medications   Medication Sig Start Date End Date Taking? Authorizing Provider  Ascorbic Acid (VITAMIN C) 1000 MG tablet Take 1,000 mg by mouth daily. Patient not taking: Reported on  04/07/2024    [provider]  Biotin 100 MG/GM POWD Take by mouth. Patient not taking: Reported on 04/07/2024    [provider]  cholecalciferol (VITAMIN D3) 25 MCG (1000 UNIT) tablet Take 1,000 Units by mouth daily. Patient not taking: Reported on 04/07/2024    [provider]  estradiol  (ESTRACE ) 0.5 MG tablet TAKE 1 TABLET BY MOUTH EVERY DAY Patient not taking: Reported on 04/07/2024 03/04/24   DominicJinnie Jansky, CNM  hydrOXYzine  (ATARAX ) 10 MG tablet Take 1 tablet (10 mg total) by mouth 3 (three) times daily as needed. 04/07/24   Ostwalt, Janna, PA-C  levonorgestrel  (MIRENA ) 20 MCG/24HR IUD 1 each by Intrauterine route once. Implanted March 2014 Patient not taking: Reported on 04/07/2024    [provider]    Allergies as of 02/25/2024 - Review Complete 02/09/2024  Allergen Reaction Noted   No known allergies  06/09/2016    Family History  Problem Relation Age of Onset   Mental illness Mother    Hypertension Mother    COPD Mother        smoker died age 50   Colon cancer Father 31   Bladder Cancer Father 73       bladder to colon her2+ died age 49    Diabetes Maternal Grandmother    Throat cancer Paternal Grandfather    Pancreatic cancer Paternal Grandfather    Stomach cancer Maternal Aunt  30   Celiac disease Half-Sister     Social History   Socioeconomic History   Marital status: Married    Spouse name: Not on file   Number of children: 2   Years of education: Not on file   Highest education level: Not on file  Occupational History   Occupation: Homemaker  Tobacco Use   Smoking status: Former    Current packs/day: 0.00    Types: Cigarettes    Quit date: 05/09/1999    Years since quitting: 24.9   Smokeless tobacco: Never  Vaping Use   Vaping status: Never Used  Substance and Sexual Activity   Alcohol use: Yes    Comment: occasional   Drug use: No   Sexual activity: Yes    Partners: Female    Birth control/protection: I.U.D.     Comment: Patient has slight spotting with IUD  Other Topics Concern   Not on file  Social History Narrative   1 daughter 91 y.o, 1 son 33 y.o as of 04/08/19   Husband Allean Montfort [phone] DPR   Social Drivers of Health   Financial Resource Strain: Not on file  Food Insecurity: Not on file  Transportation Needs: Not on file  Physical Activity: Sufficiently Active (04/08/2019)   Exercise Vital Sign    Days of Exercise per Week: 4 days    Minutes of Exercise per Session: 60 min  Stress: No Stress Concern Present (04/08/2019)   [employer] of Occupational Health - Occupational Stress Questionnaire    Feeling of Stress : Only a little  Social Connections: Unknown (06/30/2023)   Received from [facility]   Social Network    Social Network: Not on file  Intimate Partner Violence: Unknown (06/30/2023)   Received from [facility]   HITS    Physically Hurt: Not on file    Insult or Talk Down To: Not on file    Threaten Physical Harm: Not on file    Scream or Curse: Not on file    Review of Systems: See HPI, otherwise negative ROS  Physical Exam: BP 113/67   Pulse 82   Temp (!) 96.4 F (35.8 C) (Temporal)   Resp 16   Ht 5' 9 (1.753 m)   Wt 62.6 kg   LMP  (LMP Unknown) Comment: Patient states she has had IUD for years but there was a new one put in 1 month ago.  SpO2 100%   BMI 20.38 kg/m  General:   Alert,  pleasant and cooperative in NAD Head:  Normocephalic and atraumatic. Neck:  Supple; no masses or thyromegaly. Lungs:  Clear throughout to auscultation.    Heart:  Regular rate and rhythm. Abdomen:  Soft, nontender and nondistended. Normal bowel sounds, without guarding, and without rebound.   Neurologic:  Alert and  oriented x4;  grossly normal neurologically.  Impression/Plan: Ruth Williams is here for an colonoscopy to be performed for father with colon cancer  Risks, benefits, limitations, and alternatives regarding  colonoscopy have been  reviewed with the patient.  Questions have been answered.  All parties agreeable.   Rogelia Copping, MD  04/11/2024, 7:52 AM

## 2024-04-11 NOTE — Op Note (Signed)
 [facility] Patient Name: Ruth Williams Procedure Date: 04/11/2024 7:17 AM MRN: [id number] Account #: [id number] Date of Birth: [birth year] Admit Type: Outpatient Age: 43 Room: [facility] ENDO ROOM 4 Gender: Female Note Status: Finalized Instrument Name: [id number] [id number] Procedure:             Colonoscopy Indications:           Screening for colorectal malignant neoplasm, Screening                         in patient at increased risk: Family history of                         1st-degree relative with colorectal cancer Providers:             Rogelia Copping MD, MD Referring MD:          [name] (Referring MD) Medicines:             Propofol  per Anesthesia Complications:         No immediate complications. Procedure:             Pre-Anesthesia Assessment:                        - Prior to the procedure, a History and Physical was                         performed, and patient medications and allergies were                         reviewed. The patient's tolerance of previous                         anesthesia was also reviewed. The risks and benefits                         of the procedure and the sedation options and risks                         were discussed with the patient. All questions were                         answered, and informed consent was obtained. Prior                         Anticoagulants: The patient has taken no anticoagulant                         or antiplatelet agents. ASA Grade Assessment: II - A                         patient with mild systemic disease. After reviewing                         the risks and benefits, the patient was deemed in                         satisfactory condition to undergo the procedure.  After obtaining informed consent, the colonoscope was                         passed under direct vision. Throughout the procedure,                         the patient's blood  pressure, pulse, and oxygen                         saturations were monitored continuously. The                         Colonoscope was introduced through the anus and                         advanced to the the cecum, identified by appendiceal                         orifice and ileocecal valve. The colonoscopy was                         performed without difficulty. The patient tolerated                         the procedure well. The quality of the bowel                         preparation was excellent. Findings:      The perianal and digital rectal examinations were normal.      Non-bleeding internal hemorrhoids were found during retroflexion. The       hemorrhoids were Grade I (internal hemorrhoids that do not prolapse). Impression:            - Non-bleeding internal hemorrhoids.                        - No specimens collected. Recommendation:        - Discharge patient to home.                        - Resume previous diet.                        - Continue present medications.                        - Repeat colonoscopy in 5 years for surveillance. Procedure Code(s):     --- Professional ---                        [phone], Colonoscopy, flexible; diagnostic, including                         collection of specimen(s) by brushing or washing, when                         performed (separate procedure) Diagnosis Code(s):     --- Professional ---                        Z12.11, Encounter for screening for malignant  neoplasm                         of colon CPT copyright 2022 [employer]. All rights reserved. The codes documented in this report are preliminary and upon coder review may  be revised to meet current compliance requirements. Rogelia Copping MD, MD 04/11/2024 8:29:01 AM This report has been signed electronically. Number of Addenda: 0 Note Initiated On: 04/11/2024 7:17 AM Scope Withdrawal Time: 0 hours 6 minutes 58 seconds  Total Procedure Duration: 0 hours 15  minutes 21 seconds  Estimated Blood Loss:  Estimated blood loss: none.      [facility]

## 2024-04-11 NOTE — Anesthesia Postprocedure Evaluation (Signed)
 Anesthesia Post Note  Patient: Ruth Williams  Procedure(s) Performed: COLONOSCOPY  Patient location during evaluation: PACU Anesthesia Type: General Level of consciousness: awake and alert, oriented and patient cooperative Pain management: pain level controlled Vital Signs Assessment: post-procedure vital signs reviewed and stable Respiratory status: spontaneous breathing, nonlabored ventilation and respiratory function stable Cardiovascular status: blood pressure returned to baseline and stable Postop Assessment: adequate PO intake Anesthetic complications: no   No notable events documented.   Last Vitals:  Vitals:   04/11/24 0838 04/11/24 0848  BP: 110/72 106/67  Pulse: 81 64  Resp: 20 19  Temp:    SpO2: 100% 100%    Last Pain:  Vitals:   04/11/24 0848  TempSrc:   PainSc: 0-No pain                 Alfonso Ruths

## 2024-04-11 NOTE — Transfer of Care (Signed)
 Immediate Anesthesia Transfer of Care Note  Patient: Ruth Williams  Procedure(s) Performed: COLONOSCOPY  Patient Location: PACU  Anesthesia Type:MAC  Level of Consciousness: sedated  Airway & Oxygen Therapy: Patient Spontanous Breathing  Post-op Assessment: Report given to RN and Post -op Vital signs reviewed and stable  Post vital signs: Reviewed and stable  Last Vitals:  Vitals Value Taken Time  BP 88/48 04/11/24 08:28  Temp 35.7 C 04/11/24 08:28  Pulse 74 04/11/24 08:29  Resp 18 04/11/24 08:28  SpO2 100 % 04/11/24 08:29  Vitals shown include unfiled device data.  Last Pain:  Vitals:   04/11/24 0828  TempSrc: Temporal  PainSc: 0-No pain         Complications: No notable events documented.

## 2024-06-06 ENCOUNTER — Other Ambulatory Visit: Payer: Self-pay | Admitting: Licensed Practical Nurse

## 2024-06-06 DIAGNOSIS — Z1231 Encounter for screening mammogram for malignant neoplasm of breast: Secondary | ICD-10-CM

## 2024-06-07 ENCOUNTER — Ambulatory Visit (INDEPENDENT_AMBULATORY_CARE_PROVIDER_SITE_OTHER): Admitting: Physician Assistant

## 2024-06-07 ENCOUNTER — Encounter: Payer: Self-pay | Admitting: Physician Assistant

## 2024-06-07 VITALS — BP 114/71 | HR 64 | Temp 98.2°F | Ht 69.0 in | Wt 146.3 lb

## 2024-06-07 DIAGNOSIS — G4709 Other insomnia: Secondary | ICD-10-CM

## 2024-06-07 DIAGNOSIS — F4322 Adjustment disorder with anxiety: Secondary | ICD-10-CM | POA: Diagnosis not present

## 2024-06-07 DIAGNOSIS — R413 Other amnesia: Secondary | ICD-10-CM

## 2024-06-07 DIAGNOSIS — R196 Halitosis: Secondary | ICD-10-CM | POA: Diagnosis not present

## 2024-06-07 DIAGNOSIS — N951 Menopausal and female climacteric states: Secondary | ICD-10-CM

## 2024-06-07 NOTE — Progress Notes (Signed)
 Established patient visit  Patient: Ruth Williams   DOB: May 25, 1981   43 y.o. Female  MRN: [id number] Visit Date: 06/07/2024  Today's healthcare provider: [name], PA-C   Chief Complaint  Patient presents with   Medical Management of Chronic Issues    Patient presents for chronic condition follow up.  Reports feeling well overall no acute concerns.    Subjective     HPI     Medical Management of Chronic Issues    Additional comments: Patient presents for chronic condition follow up.  Reports feeling well overall no acute concerns.       Last edited by Cherry Chiquita HERO, CMA on 06/07/2024 10:24 AM.       Discussed the use of AI scribe software for clinical note transcription with the patient, who gave verbal consent to proceed.  History of Present Illness Ruth Williams is a 43 year old female who presents for follow-up of chronic conditions including perimenopausal symptoms, anxiety, and sleep disturbances.  She feels well overall since her last visit on June 26th. She has discontinued estradiol  and attributes her improved well-being to the summer break and reduced stress. A Mirena  IUD was placed at the end of the previous year, and she does not have to manage her menstrual cycles.  She experiences poor sleep quality with frequent awakenings and difficulty returning to sleep. She did not use the prescribed sleep medication during a recent trip, as she was able to sleep well due to exhaustion. She is a light sleeper, often waking to use the bathroom, but her sleep issues are not debilitating as she maintains her daily activities, including going to the gym.  She has a persistent issue with halitosis despite good oral hygiene practices, including frequent brushing, gum chewing, and regular dental cleanings. She also experiences xerostomia despite adequate water  intake.  Memory concerns, including stuttering, have improved but occasionally recur. She has not had to  manage significant responsibilities over the summer, which may have contributed to fewer memory issues. No current headaches or bowel movement issues. Her menstrual bleeding has stabilized since the initial adjustment period following the Mirena  IUD placement.       06/07/2024   10:21 AM 04/07/2024    9:24 AM 11/11/2023    9:22 AM  Depression screen PHQ 2/9  Decreased Interest 0 0 0  Down, Depressed, Hopeless 0 0 0  PHQ - 2 Score 0 0 0  Altered sleeping 2 1 0  Tired, decreased energy 0 0 0  Change in appetite 0 0 0  Feeling bad or failure about yourself  0 0 0  Trouble concentrating 0 0 0  Moving slowly or fidgety/restless 0 0 0  Suicidal thoughts 0 0 0  PHQ-9 Score 2 1 0  Difficult doing work/chores Not difficult at all Not difficult at all Not difficult at all      06/07/2024   10:21 AM 04/07/2024    9:25 AM 11/11/2023    9:22 AM 02/20/2023   11:22 AM  GAD 7 : Generalized Anxiety Score  Nervous, Anxious, on Edge 0 1 2 1   Control/stop worrying 0 0 2 0  Worry too much - different things 0 0 2 0  Trouble relaxing 0 0 0 0  Restless 0 0 0 0  Easily annoyed or irritable 0 0 2 1  Afraid - awful might happen 0 0 1 0  Total GAD 7 Score 0 1 9 2   Anxiety Difficulty Not difficult at all  Not difficult at all Somewhat difficult     Medications: Outpatient Medications Prior to Visit  Medication Sig   Ascorbic Acid (VITAMIN C) 1000 MG tablet Take 1,000 mg by mouth daily.   Biotin 100 MG/GM POWD Take by mouth.   cholecalciferol (VITAMIN D3) 25 MCG (1000 UNIT) tablet Take 1,000 Units by mouth daily.   hydrOXYzine  (ATARAX ) 10 MG tablet Take 1 tablet (10 mg total) by mouth 3 (three) times daily as needed.   levonorgestrel  (MIRENA ) 20 MCG/24HR IUD 1 each by Intrauterine route once. Implanted March 2014   estradiol  (ESTRACE ) 0.5 MG tablet TAKE 1 TABLET BY MOUTH EVERY DAY (Patient not taking: Reported on 06/07/2024)   No facility-administered medications prior to visit.    Review of  Systems All negative Except see HPI   {Insert previous labs (optional):23779} {See past labs  Heme  Chem  Endocrine  Serology  Results Review (optional):1}   Objective    BP 114/71 (BP Location: Left Arm, Patient Position: Sitting, Cuff Size: Normal)   Pulse 64   Temp 98.2 F (36.8 C) (Oral)   Ht 5' 9 (1.753 m)   Wt 146 lb 4.8 oz (66.4 kg)   SpO2 100%   BMI 21.60 kg/m  {Insert last BP/Wt (optional):23777}{See vitals history (optional):1}   Physical Exam   No results found for any visits on 06/07/24.      Assessment and Plan Assessment & Plan Perimenopausal symptoms Symptoms well-managed without estradiol , likely due to lifestyle changes and Mirena  IUD. - Continue current management without estradiol .  Insomnia Chronic insomnia with frequent awakenings, possibly influenced by anxiety and environment. No prescribed sleep medication used. - Implement sleep hygiene measures such as blackout curtains and reducing electronic device usage. - Consider over-the-counter melatonin if needed.  Anxiety Symptoms present but not debilitating. Discussed potential counseling for overall well-being. - Consider counseling services if anxiety symptoms worsen.  Halitosis and xerostomia Chronic halitosis and xerostomia despite good oral hygiene, possibly throat-related. No prior ENT consultation. - Refer to ENT for evaluation of throat-related causes.  Subjective memory impairment and stuttering Subjective memory impairment and intermittent stuttering with no headaches or neurological symptoms. Discussed potential vitamin deficiencies and neurological evaluation. Neurology referral and MRI require insurance approval and current symptoms may not justify it. - Perform memory testing. - Consider B12 and folate testing. - Evaluate need for neurology referral based on memory test results.    No orders of the defined types were placed in this encounter.   No follow-ups on file.    The patient was advised to call back or seek an in-person evaluation if the symptoms worsen or if the condition fails to improve as anticipated.  I discussed the assessment and treatment plan with the patient. The patient was provided an opportunity to ask questions and all were answered. The patient agreed with the plan and demonstrated an understanding of the instructions.  I, Chabely Norby, PA-C have reviewed all documentation for this visit. The documentation on 06/07/2024  for the exam, diagnosis, procedures, and orders are all accurate and complete.  [name], [facility], MMS [facility] [phone] (phone) [phone] (fax)  [facility]

## 2024-06-30 ENCOUNTER — Other Ambulatory Visit: Payer: Self-pay | Admitting: Licensed Practical Nurse

## 2024-06-30 ENCOUNTER — Ambulatory Visit
Admission: RE | Admit: 2024-06-30 | Discharge: 2024-06-30 | Disposition: A | Discharge status: Home / Self Care | Source: Ambulatory Visit | Attending: Licensed Practical Nurse | Admitting: Licensed Practical Nurse

## 2024-06-30 DIAGNOSIS — Z1231 Encounter for screening mammogram for malignant neoplasm of breast: Secondary | ICD-10-CM | POA: Diagnosis not present

## 2024-10-26 ENCOUNTER — Encounter: Payer: Self-pay | Admitting: Physician Assistant

## 2024-11-22 ENCOUNTER — Ambulatory Visit: Admitting: Physician Assistant

## 2025-04-10 ENCOUNTER — Encounter: Admitting: Physician Assistant
